# Patient Record
Sex: Male | Born: 1995 | Race: Black or African American | Hispanic: No | Marital: Single | Smoking: Current every day smoker
Health system: Southern US, Community
[De-identification: ages and names within clinical notes are randomized; demographics above are authoritative.]

## PROBLEM LIST (undated history)

## (undated) DIAGNOSIS — W3400XA Accidental discharge from unspecified firearms or gun, initial encounter: Secondary | ICD-10-CM

## (undated) DIAGNOSIS — Y249XXA Unspecified firearm discharge, undetermined intent, initial encounter: Secondary | ICD-10-CM

## (undated) HISTORY — PX: FEMUR FRACTURE SURGERY: SHX633

---
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
## 2015-01-20 ENCOUNTER — Emergency Department: Payer: Self-pay | Admitting: Emergency Medicine

## 2015-10-31 ENCOUNTER — Emergency Department
Admission: EM | Admit: 2015-10-31 | Discharge: 2015-10-31 | Disposition: A | Discharge status: Home / Self Care | Payer: Self-pay | Attending: Emergency Medicine | Admitting: Emergency Medicine

## 2015-10-31 ENCOUNTER — Emergency Department: Payer: Self-pay

## 2015-10-31 DIAGNOSIS — R600 Localized edema: Secondary | ICD-10-CM | POA: Insufficient documentation

## 2015-10-31 DIAGNOSIS — M25562 Pain in left knee: Secondary | ICD-10-CM

## 2015-10-31 HISTORY — DX: Accidental discharge from unspecified firearms or gun, initial encounter: W34.00XA

## 2015-10-31 HISTORY — DX: Unspecified firearm discharge, undetermined intent, initial encounter: Y24.9XXA

## 2015-10-31 MED ORDER — PREDNISONE 10 MG PO TABS: ORAL_TABLET | ORAL | Status: DC
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
## 2016-02-15 LAB — HM HIV SCREENING LAB: HM HIV Screening: NEGATIVE

## 2016-02-25 DIAGNOSIS — L309 Dermatitis, unspecified: Secondary | ICD-10-CM | POA: Insufficient documentation

## 2016-03-24 ENCOUNTER — Emergency Department: Payer: Self-pay

## 2016-03-24 ENCOUNTER — Encounter: Payer: Self-pay | Admitting: Emergency Medicine

## 2016-03-24 ENCOUNTER — Emergency Department
Admission: EM | Admit: 2016-03-24 | Discharge: 2016-03-24 | Disposition: A | Discharge status: Home / Self Care | Payer: Self-pay | Attending: Emergency Medicine | Admitting: Emergency Medicine

## 2016-03-24 DIAGNOSIS — M25562 Pain in left knee: Secondary | ICD-10-CM
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
## 2016-03-24 NOTE — Discharge Instructions (Signed)
Follow-up with Dr. Martha ClanKrasinski if any continued problems with your knee. Ice to to your knee after any physical activity such as sports. Also take anti-inflammatories over-the-counter after any sports activity. At this time he may take either ibuprofen 3 tablets with food 3 times a day or take Aleve 2 tablets twice a day with food.
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
## 2016-04-30 ENCOUNTER — Encounter: Payer: Self-pay | Admitting: *Deleted

## 2016-04-30 ENCOUNTER — Emergency Department
Admission: EM | Admit: 2016-04-30 | Discharge: 2016-04-30 | Disposition: A | Discharge status: Home / Self Care | Payer: Self-pay | Attending: Emergency Medicine | Admitting: Emergency Medicine

## 2016-04-30 DIAGNOSIS — M795 Residual foreign body in soft tissue: Secondary | ICD-10-CM

## 2016-04-30 DIAGNOSIS — Z1889 Other specified retained foreign body fragments: Secondary | ICD-10-CM | POA: Insufficient documentation

## 2016-04-30 DIAGNOSIS — L02211 Cutaneous abscess of abdominal wall: Secondary | ICD-10-CM | POA: Insufficient documentation

## 2016-04-30 DIAGNOSIS — F17200 Nicotine dependence, unspecified, uncomplicated: Secondary | ICD-10-CM | POA: Insufficient documentation

## 2016-04-30 MED ORDER — HYDROCODONE-ACETAMINOPHEN 5-325 MG PO TABS: 1.0000 | ORAL_TABLET | ORAL | Status: DC | PRN

## 2016-04-30 MED ORDER — CLINDAMYCIN HCL 300 MG PO CAPS: 300.0000 mg | ORAL_CAPSULE | Freq: Four times a day (QID) | ORAL | Status: DC
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
## 2016-04-30 NOTE — Discharge Instructions (Signed)
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
## 2016-05-01 ENCOUNTER — Emergency Department
Admission: EM | Admit: 2016-05-01 | Discharge: 2016-05-01 | Disposition: A | Discharge status: Home / Self Care | Payer: Self-pay | Attending: Emergency Medicine | Admitting: Emergency Medicine

## 2016-05-01 ENCOUNTER — Encounter: Payer: Self-pay | Admitting: *Deleted

## 2016-05-01 ENCOUNTER — Telehealth: Payer: Self-pay

## 2016-05-01 ENCOUNTER — Emergency Department: Payer: Self-pay

## 2016-05-01 DIAGNOSIS — F17200 Nicotine dependence, unspecified, uncomplicated: Secondary | ICD-10-CM | POA: Insufficient documentation

## 2016-05-01 DIAGNOSIS — Z79899 Other long term (current) drug therapy: Secondary | ICD-10-CM | POA: Insufficient documentation

## 2016-05-01 DIAGNOSIS — L02211 Cutaneous abscess of abdominal wall: Secondary | ICD-10-CM | POA: Insufficient documentation

## 2016-05-01 DIAGNOSIS — Z792 Long term (current) use of antibiotics: Secondary | ICD-10-CM | POA: Insufficient documentation

## 2016-05-01 DIAGNOSIS — L0291 Cutaneous abscess, unspecified: Secondary | ICD-10-CM

## 2016-05-01 DIAGNOSIS — M795 Residual foreign body in soft tissue: Secondary | ICD-10-CM

## 2016-05-01 DIAGNOSIS — W3400XA Accidental discharge from unspecified firearms or gun, initial encounter: Secondary | ICD-10-CM

## 2016-05-01 MED ORDER — LIDOCAINE-EPINEPHRINE (PF) 1 %-1:200000 IJ SOLN
10.0000 mL | Freq: Once | INTRAMUSCULAR | Status: DC
Start: 2016-05-01 — End: 2016-05-01
  Filled 2016-05-01: qty 30
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
## 2016-05-01 NOTE — Discharge Instructions (Signed)
Incision and Drainage Incision and drainage is a procedure in which a sac-like structure (cystic structure) is opened and drained. The area to be drained usually contains material such as pus, fluid, or blood.  LET YOUR CAREGIVER KNOW ABOUT:   Allergies to medicine.  Medicines taken, including vitamins, herbs, eyedrops, over-the-counter medicines, and creams.  Use of steroids (by mouth or creams).  Previous problems with anesthetics or numbing medicines.  History of bleeding problems or blood clots.  Previous surgery.  Other health problems, including diabetes and kidney problems.  Possibility of pregnancy, if this applies. RISKS AND COMPLICATIONS  Pain.  Bleeding.  Scarring.  Infection. BEFORE THE PROCEDURE  You may need to have an ultrasound or other imaging tests to see how large or deep your cystic structure is. Blood tests may also be used to determine if you have an infection or how severe the infection is. You may need to have a tetanus shot. PROCEDURE  The affected area is cleaned with a cleaning fluid. The cyst area will then be numbed with a medicine (local anesthetic). A small incision will be made in the cystic structure. A syringe or catheter may be used to drain the contents of the cystic structure, or the contents may be squeezed out. The area will then be flushed with a cleansing solution. After cleansing the area, it is often gently packed with a gauze or another wound dressing. Once it is packed, it will be covered with gauze and tape or some other type of wound dressing. AFTER THE PROCEDURE   Often, you will be allowed to go home right after the procedure.  You may be given antibiotic medicine to prevent or heal an infection.  If the area was packed with gauze or some other wound dressing, you will likely need to come back in 1 to 2 days to get it removed.  The area should heal in about 14 days.   This information is not intended to replace advice given  to you by your health care provider. Make sure you discuss any questions you have with your health care provider.   Document Released: 04/29/2001 Document Revised: 05/04/2012 Document Reviewed: 12/29/2011 Elsevier Interactive Patient Education 2016 [employer]  
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
## 2016-05-07 ENCOUNTER — Ambulatory Visit: Payer: Self-pay | Admitting: Surgery

## 2016-09-20 ENCOUNTER — Encounter: Payer: Self-pay | Admitting: Emergency Medicine

## 2016-09-20 ENCOUNTER — Emergency Department
Admission: EM | Admit: 2016-09-20 | Discharge: 2016-09-20 | Disposition: A | Discharge status: Home / Self Care | Payer: Self-pay | Attending: Emergency Medicine | Admitting: Emergency Medicine

## 2016-09-20 ENCOUNTER — Emergency Department
Admission: EM | Admit: 2016-09-20 | Discharge: 2016-09-20 | Disposition: A | Discharge status: Home / Self Care | Payer: Medicaid Other | Attending: Emergency Medicine | Admitting: Emergency Medicine

## 2016-09-20 DIAGNOSIS — R51 Headache: Secondary | ICD-10-CM | POA: Insufficient documentation

## 2016-09-20 DIAGNOSIS — F17200 Nicotine dependence, unspecified, uncomplicated: Secondary | ICD-10-CM | POA: Insufficient documentation

## 2016-09-20 DIAGNOSIS — F1290 Cannabis use, unspecified, uncomplicated: Secondary | ICD-10-CM | POA: Insufficient documentation

## 2016-09-20 DIAGNOSIS — R519 Headache, unspecified: Secondary | ICD-10-CM

## 2016-09-20 DIAGNOSIS — T407X5A Adverse effect of cannabis (derivatives), initial encounter: Secondary | ICD-10-CM | POA: Insufficient documentation

## 2016-09-20 DIAGNOSIS — G2402 Drug induced acute dystonia: Secondary | ICD-10-CM | POA: Insufficient documentation

## 2016-09-20 DIAGNOSIS — Z79899 Other long term (current) drug therapy: Secondary | ICD-10-CM | POA: Insufficient documentation

## 2016-09-20 LAB — COMPREHENSIVE METABOLIC PANEL
ALBUMIN: 4 g/dL (ref 3.5–5.0)
ALT: 20 U/L (ref 17–63)
AST: 39 U/L (ref 15–41)
Alkaline Phosphatase: 91 U/L (ref 38–126)
Anion gap: 9 (ref 5–15)
BUN: 10 mg/dL (ref 6–20)
CHLORIDE: 106 mmol/L (ref 101–111)
CO2: 22 mmol/L (ref 22–32)
Calcium: 8.7 mg/dL — ABNORMAL LOW (ref 8.9–10.3)
Creatinine, Ser: 0.96 mg/dL (ref 0.61–1.24)
GFR calc Af Amer: >60 mL/min (ref >60)
GFR calc non Af Amer: >60 mL/min (ref >60)
GLUCOSE: 117 mg/dL — AB (ref 65–99)
POTASSIUM: 3.5 mmol/L (ref 3.5–5.1)
Sodium: 137 mmol/L (ref 135–145)
Total Bilirubin: 1.4 mg/dL — ABNORMAL HIGH (ref 0.3–1.2)
Total Protein: 7.7 g/dL (ref 6.5–8.1)

## 2016-09-20 LAB — CBC WITH DIFFERENTIAL/PLATELET
BASOS ABS: 0.1 10*3/uL (ref 0–0.1)
BASOS PCT: 1 %
EOS PCT: 2 %
Eosinophils Absolute: 0.2 10*3/uL (ref 0–0.7)
HEMATOCRIT: 38.8 % — AB (ref 40.0–52.0)
Hemoglobin: 13.8 g/dL (ref 13.0–18.0)
Lymphocytes Relative: 44 %
Lymphs Abs: 4.6 10*3/uL — ABNORMAL HIGH (ref 1.0–3.6)
MCH: 33.8 pg (ref 26.0–34.0)
MCHC: 35.6 g/dL (ref 32.0–36.0)
MCV: 94.9 fL (ref 80.0–100.0)
MONO ABS: 1.3 10*3/uL — AB (ref 0.2–1.0)
Monocytes Relative: 12 %
NEUTROS ABS: 4.4 10*3/uL (ref 1.4–6.5)
Neutrophils Relative %: 41 %
PLATELETS: 249 10*3/uL (ref 150–440)
RBC: 4.09 MIL/uL — AB (ref 4.40–5.90)
RDW: 11.8 % (ref 11.5–14.5)
WBC: 10.6 10*3/uL (ref 3.8–10.6)

## 2016-09-20 MED ORDER — SODIUM CHLORIDE 0.9 % IV BOLUS (SEPSIS)
500.0000 mL | Freq: Once | INTRAVENOUS | Status: AC
  Administered 2016-09-20: 500 mL via INTRAVENOUS

## 2016-09-20 MED ORDER — PROCHLORPERAZINE MALEATE 10 MG PO TABS
10.0000 mg | ORAL_TABLET | Freq: Three times a day (TID) | ORAL | 0 refills | Status: DC | PRN
Start: 2016-09-20 — End: 2017-05-28

## 2016-09-20 MED ORDER — DIPHENHYDRAMINE HCL 25 MG PO CAPS
25.0000 mg | ORAL_CAPSULE | Freq: Four times a day (QID) | ORAL | 0 refills | Status: DC
Start: 2016-09-20 — End: 2019-03-18

## 2016-09-20 MED ORDER — DIPHENHYDRAMINE HCL 50 MG/ML IJ SOLN
25.0000 mg | Freq: Once | INTRAMUSCULAR | Status: AC
  Administered 2016-09-20: 25 mg via INTRAVENOUS
  Filled 2016-09-20: qty 1

## 2016-09-20 MED ORDER — PROCHLORPERAZINE EDISYLATE 5 MG/ML IJ SOLN
10.0000 mg | Freq: Once | INTRAMUSCULAR | Status: AC
  Administered 2016-09-20: 10 mg via INTRAVENOUS
  Filled 2016-09-20: qty 2
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
## 2016-09-20 NOTE — ED Notes (Signed)
Resting quietly at this time.

## 2016-09-20 NOTE — ED Triage Notes (Addendum)
Patient states that he took was smoking a black and mild and started feeling like his tongue was swelling. Patient states that he feels like the swelling has gone down at this time.

## 2016-09-20 NOTE — ED Notes (Signed)
Patient's friend reports he was seen earlier in the day and treated for a nose bleed.  After discharge he went home.  Upon awaking patient had jerking motions and stated it felt like his tongue was numb and swelling.  Patient with patent airway, PA to bedside.

## 2016-09-20 NOTE — ED Triage Notes (Signed)
Reports headache and nosebleed off and on since yesterday.  PERRL, ambulates well.
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
## 2016-09-20 NOTE — Discharge Instructions (Signed)
Please seek medical attention for any high fevers, chest pain, shortness of breath, change in behavior, persistent vomiting, bloody stool or any other new or concerning symptoms.  

## 2016-10-30 ENCOUNTER — Encounter: Payer: Self-pay | Admitting: Urgent Care

## 2016-10-30 DIAGNOSIS — F17200 Nicotine dependence, unspecified, uncomplicated: Secondary | ICD-10-CM | POA: Insufficient documentation

## 2016-10-30 DIAGNOSIS — R197 Diarrhea, unspecified: Secondary | ICD-10-CM | POA: Insufficient documentation

## 2016-10-30 NOTE — ED Triage Notes (Signed)
Patient presents with c/o generalized abdominal pain with (+) diarrhea x 3 days. Patient unable to report the number of diarrhea episodes. States, "everything that I eat just runs through me". Patient has also had a headache today; reports that it is intermittent in nature; resolved at this time.

## 2016-10-31 ENCOUNTER — Emergency Department
Admission: EM | Admit: 2016-10-31 | Discharge: 2016-10-31 | Disposition: A | Discharge status: Home / Self Care | Payer: Self-pay | Attending: Student in an Organized Health Care Education/Training Program | Admitting: Student in an Organized Health Care Education/Training Program

## 2016-10-31 DIAGNOSIS — R197 Diarrhea, unspecified: Secondary | ICD-10-CM

## 2016-10-31 MED ORDER — DICYCLOMINE HCL 10 MG PO CAPS
10.0000 mg | ORAL_CAPSULE | Freq: Once | ORAL | Status: AC
  Administered 2016-10-31: 10 mg via ORAL
  Filled 2016-10-31: qty 1

## 2016-10-31 MED ORDER — DICYCLOMINE HCL 10 MG PO CAPS: 10.0000 mg | ORAL_CAPSULE | Freq: Three times a day (TID) | ORAL | 0 refills | Status: DC | PRN

## 2016-10-31 MED ORDER — LOPERAMIDE HCL 2 MG PO CAPS
2.0000 mg | ORAL_CAPSULE | Freq: Once | ORAL | Status: AC
  Administered 2016-10-31: 2 mg via ORAL
  Filled 2016-10-31: qty 1

## 2016-10-31 NOTE — ED Notes (Signed)
Pt presents to ED 18 c/o of "stomach bothering me"; pt c/o of diarrhea of unspecified times; pt states this started 3 days ago; pt denies any blood in stool; pt denies nausea and vomiting; pt denies shortness of breath, dizziness, blurry vision; pt is alert and oriented x4

## 2016-10-31 NOTE — ED Notes (Signed)
Pt is in good condition; discharge instructions reviewed, follow up care and home care reviewed; pt verbalized understanding; pt is ambulatory and went home with girlfriend/spouse.

## 2016-10-31 NOTE — ED Notes (Signed)
Pt reports no nausea, vomiting, or diarrhea since ingesting saltines and ginger ale.

## 2016-10-31 NOTE — Discharge Instructions (Signed)
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
## 2016-10-31 NOTE — ED Notes (Signed)
Pt given one packet of saltine crackers and half can of ginger ale over ice to eat and drink per MD order as PO challenge. Will monitor for tolerance.

## 2016-12-24 ENCOUNTER — Encounter: Payer: Self-pay | Admitting: Emergency Medicine

## 2016-12-24 DIAGNOSIS — Z5321 Procedure and treatment not carried out due to patient leaving prior to being seen by health care provider: Secondary | ICD-10-CM | POA: Insufficient documentation

## 2016-12-24 DIAGNOSIS — F17200 Nicotine dependence, unspecified, uncomplicated: Secondary | ICD-10-CM | POA: Insufficient documentation

## 2016-12-24 DIAGNOSIS — R109 Unspecified abdominal pain: Secondary | ICD-10-CM | POA: Insufficient documentation

## 2016-12-24 LAB — CBC
HEMATOCRIT: 38.9 % — AB (ref 40.0–52.0)
Hemoglobin: 13.5 g/dL (ref 13.0–18.0)
MCH: 33 pg (ref 26.0–34.0)
MCHC: 34.7 g/dL (ref 32.0–36.0)
MCV: 95.2 fL (ref 80.0–100.0)
Platelets: 209 10*3/uL (ref 150–440)
RBC: 4.09 MIL/uL — AB (ref 4.40–5.90)
RDW: 12.2 % (ref 11.5–14.5)
WBC: 7.1 10*3/uL (ref 3.8–10.6)

## 2016-12-24 LAB — COMPREHENSIVE METABOLIC PANEL
ALT: 115 U/L — ABNORMAL HIGH (ref 17–63)
AST: 197 U/L — AB (ref 15–41)
Albumin: 4.2 g/dL (ref 3.5–5.0)
Alkaline Phosphatase: 74 U/L (ref 38–126)
Anion gap: 7 (ref 5–15)
BUN: 13 mg/dL (ref 6–20)
CHLORIDE: 108 mmol/L (ref 101–111)
CO2: 24 mmol/L (ref 22–32)
Calcium: 9.2 mg/dL (ref 8.9–10.3)
Creatinine, Ser: 0.96 mg/dL (ref 0.61–1.24)
GFR calc non Af Amer: >60 mL/min (ref >60)
Glucose, Bld: 109 mg/dL — ABNORMAL HIGH (ref 65–99)
POTASSIUM: 3.5 mmol/L (ref 3.5–5.1)
Sodium: 139 mmol/L (ref 135–145)
Total Bilirubin: 2.1 mg/dL — ABNORMAL HIGH (ref 0.3–1.2)
Total Protein: 7.5 g/dL (ref 6.5–8.1)

## 2016-12-24 LAB — LIPASE, BLOOD: LIPASE: 20 U/L (ref 11–51)

## 2016-12-24 NOTE — ED Triage Notes (Signed)
Patient ambulatory to triage with steady gait, without difficulty or distress noted; pt reports mid abd pain with no accomp symptoms x 2 days

## 2016-12-25 ENCOUNTER — Emergency Department
Admission: EM | Admit: 2016-12-25 | Discharge: 2016-12-25 | Disposition: A | Discharge status: Home / Self Care | Payer: Self-pay | Attending: Emergency Medicine | Admitting: Emergency Medicine

## 2016-12-25 ENCOUNTER — Telehealth: Payer: Self-pay | Admitting: Emergency Medicine

## 2016-12-25 NOTE — Telephone Encounter (Signed)
Called patient due to lwot to inquire about condition and follow up plans. He said the wait was too long. I told him we had done the lab tests and that there were some that were abnormal that can be related tohis liver.  I told him he could return or go to an urgent care (he does not have a pcp.)

## 2017-01-14 ENCOUNTER — Emergency Department
Admission: EM | Admit: 2017-01-14 | Discharge: 2017-01-14 | Disposition: A | Discharge status: Home / Self Care | Payer: Self-pay | Attending: Emergency Medicine | Admitting: Emergency Medicine

## 2017-01-14 ENCOUNTER — Emergency Department: Payer: Self-pay

## 2017-01-14 ENCOUNTER — Encounter: Payer: Self-pay | Admitting: *Deleted

## 2017-01-14 DIAGNOSIS — S62339A Displaced fracture of neck of unspecified metacarpal bone, initial encounter for closed fracture: Secondary | ICD-10-CM

## 2017-01-14 DIAGNOSIS — Y999 Unspecified external cause status: Secondary | ICD-10-CM | POA: Insufficient documentation

## 2017-01-14 DIAGNOSIS — W2201XA Walked into wall, initial encounter: Secondary | ICD-10-CM | POA: Insufficient documentation

## 2017-01-14 DIAGNOSIS — F17200 Nicotine dependence, unspecified, uncomplicated: Secondary | ICD-10-CM | POA: Insufficient documentation

## 2017-01-14 DIAGNOSIS — Y939 Activity, unspecified: Secondary | ICD-10-CM | POA: Insufficient documentation

## 2017-01-14 DIAGNOSIS — Y929 Unspecified place or not applicable: Secondary | ICD-10-CM | POA: Insufficient documentation

## 2017-01-14 DIAGNOSIS — S62367A Nondisplaced fracture of neck of fifth metacarpal bone, left hand, initial encounter for closed fracture: Secondary | ICD-10-CM | POA: Insufficient documentation

## 2017-01-14 MED ORDER — NAPROXEN 500 MG PO TABS: 500.0000 mg | ORAL_TABLET | Freq: Two times a day (BID) | ORAL | 0 refills | Status: DC
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
## 2017-01-14 NOTE — Discharge Instructions (Signed)
Please wear splint until he follow-up with orthopedics. Keep splint clean and dry. Take naproxen as needed for pain.

## 2017-02-05 ENCOUNTER — Emergency Department
Admission: EM | Admit: 2017-02-05 | Discharge: 2017-02-05 | Disposition: A | Discharge status: Home / Self Care | Payer: No Typology Code available for payment source | Attending: Emergency Medicine | Admitting: Emergency Medicine

## 2017-02-05 ENCOUNTER — Encounter: Payer: Self-pay | Admitting: Emergency Medicine

## 2017-02-05 DIAGNOSIS — S161XXA Strain of muscle, fascia and tendon at neck level, initial encounter: Secondary | ICD-10-CM | POA: Insufficient documentation

## 2017-02-05 DIAGNOSIS — F17200 Nicotine dependence, unspecified, uncomplicated: Secondary | ICD-10-CM | POA: Diagnosis not present

## 2017-02-05 DIAGNOSIS — Y939 Activity, unspecified: Secondary | ICD-10-CM | POA: Diagnosis not present

## 2017-02-05 DIAGNOSIS — S199XXA Unspecified injury of neck, initial encounter: Secondary | ICD-10-CM | POA: Diagnosis present

## 2017-02-05 DIAGNOSIS — Y999 Unspecified external cause status: Secondary | ICD-10-CM | POA: Diagnosis not present

## 2017-02-05 DIAGNOSIS — Y92410 Unspecified street and highway as the place of occurrence of the external cause: Secondary | ICD-10-CM | POA: Diagnosis not present

## 2017-02-05 MED ORDER — METHOCARBAMOL 500 MG PO TABS: 500.0000 mg | ORAL_TABLET | Freq: Four times a day (QID) | ORAL | 0 refills | Status: DC

## 2017-02-05 MED ORDER — MELOXICAM 15 MG PO TABS: 15.0000 mg | ORAL_TABLET | Freq: Every day | ORAL | 0 refills | Status: DC
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
## 2017-02-05 NOTE — ED Notes (Signed)
NAD noted at time of D/C. Pt denies questions or concerns. Pt ambulatory to the lobby at this time.  

## 2017-03-23 ENCOUNTER — Encounter: Payer: Self-pay | Admitting: Emergency Medicine

## 2017-03-23 ENCOUNTER — Emergency Department
Admission: EM | Admit: 2017-03-23 | Discharge: 2017-03-23 | Disposition: A | Discharge status: Home / Self Care | Payer: Self-pay | Attending: Emergency Medicine | Admitting: Emergency Medicine

## 2017-03-23 DIAGNOSIS — E860 Dehydration: Secondary | ICD-10-CM | POA: Insufficient documentation

## 2017-03-23 DIAGNOSIS — R42 Dizziness and giddiness: Secondary | ICD-10-CM

## 2017-03-23 DIAGNOSIS — Z79899 Other long term (current) drug therapy: Secondary | ICD-10-CM | POA: Insufficient documentation

## 2017-03-23 DIAGNOSIS — F17200 Nicotine dependence, unspecified, uncomplicated: Secondary | ICD-10-CM | POA: Insufficient documentation

## 2017-03-23 LAB — URINALYSIS, COMPLETE (UACMP) WITH MICROSCOPIC
Bacteria, UA: NONE SEEN
Bilirubin Urine: NEGATIVE
GLUCOSE, UA: NEGATIVE mg/dL
HGB URINE DIPSTICK: NEGATIVE
KETONES UR: NEGATIVE mg/dL
LEUKOCYTES UA: NEGATIVE
Nitrite: NEGATIVE
PH: 6 (ref 5.0–8.0)
Protein, ur: NEGATIVE mg/dL
RBC / HPF: NONE SEEN RBC/hpf (ref 0–5)
Specific Gravity, Urine: 1.011 (ref 1.005–1.030)

## 2017-03-23 LAB — BASIC METABOLIC PANEL
Anion gap: 9 (ref 5–15)
BUN: 11 mg/dL (ref 6–20)
CHLORIDE: 106 mmol/L (ref 101–111)
CO2: 24 mmol/L (ref 22–32)
CREATININE: 0.98 mg/dL (ref 0.61–1.24)
Calcium: 9 mg/dL (ref 8.9–10.3)
GFR calc non Af Amer: >60 mL/min (ref >60)
Glucose, Bld: 84 mg/dL (ref 65–99)
Potassium: 3.3 mmol/L — ABNORMAL LOW (ref 3.5–5.1)
Sodium: 139 mmol/L (ref 135–145)

## 2017-03-23 LAB — CBC
HCT: 40 % (ref 40.0–52.0)
Hemoglobin: 14 g/dL (ref 13.0–18.0)
MCH: 33.2 pg (ref 26.0–34.0)
MCHC: 35 g/dL (ref 32.0–36.0)
MCV: 94.8 fL (ref 80.0–100.0)
PLATELETS: 225 10*3/uL (ref 150–440)
RBC: 4.22 MIL/uL — ABNORMAL LOW (ref 4.40–5.90)
RDW: 12.1 % (ref 11.5–14.5)
WBC: 6.3 10*3/uL (ref 3.8–10.6)
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
## 2017-03-23 NOTE — ED Triage Notes (Signed)
Pt reports increased dizziness and weakness x2 days. 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 Pt denies any blood borne exposure. Pt ambulatory to triage room with no difficulty or distress noted.

## 2017-03-23 NOTE — ED Notes (Signed)
Cup of water given.

## 2017-03-29 ENCOUNTER — Emergency Department
Admission: EM | Admit: 2017-03-29 | Discharge: 2017-03-29 | Disposition: A | Discharge status: Home / Self Care | Payer: Self-pay | Attending: Emergency Medicine | Admitting: Emergency Medicine

## 2017-03-29 ENCOUNTER — Encounter: Payer: Self-pay | Admitting: Emergency Medicine

## 2017-03-29 ENCOUNTER — Emergency Department: Payer: Self-pay

## 2017-03-29 DIAGNOSIS — Z23 Encounter for immunization: Secondary | ICD-10-CM | POA: Insufficient documentation

## 2017-03-29 DIAGNOSIS — Y999 Unspecified external cause status: Secondary | ICD-10-CM | POA: Insufficient documentation

## 2017-03-29 DIAGNOSIS — S41112A Laceration without foreign body of left upper arm, initial encounter: Secondary | ICD-10-CM | POA: Insufficient documentation

## 2017-03-29 DIAGNOSIS — T148XXA Other injury of unspecified body region, initial encounter: Secondary | ICD-10-CM

## 2017-03-29 DIAGNOSIS — W260XXA Contact with knife, initial encounter: Secondary | ICD-10-CM | POA: Insufficient documentation

## 2017-03-29 DIAGNOSIS — Z79899 Other long term (current) drug therapy: Secondary | ICD-10-CM | POA: Insufficient documentation

## 2017-03-29 DIAGNOSIS — Y939 Activity, unspecified: Secondary | ICD-10-CM | POA: Insufficient documentation

## 2017-03-29 DIAGNOSIS — Y92019 Unspecified place in single-family (private) house as the place of occurrence of the external cause: Secondary | ICD-10-CM | POA: Insufficient documentation

## 2017-03-29 DIAGNOSIS — F17210 Nicotine dependence, cigarettes, uncomplicated: Secondary | ICD-10-CM | POA: Insufficient documentation

## 2017-03-29 MED ORDER — AMOXICILLIN-POT CLAVULANATE 875-125 MG PO TABS: 1.0000 | ORAL_TABLET | Freq: Two times a day (BID) | ORAL | 0 refills | Status: AC

## 2017-03-29 MED ORDER — LIDOCAINE HCL (PF) 1 % IJ SOLN
INTRAMUSCULAR | Status: AC
  Administered 2017-03-29: 5 mL via INTRADERMAL
  Filled 2017-03-29: qty 5

## 2017-03-29 MED ORDER — LIDOCAINE HCL (PF) 1 % IJ SOLN
5.0000 mL | Freq: Once | INTRAMUSCULAR | Status: AC
  Administered 2017-03-29: 5 mL via INTRADERMAL

## 2017-03-29 MED ORDER — OXYCODONE-ACETAMINOPHEN 5-325 MG PO TABS
1.0000 | ORAL_TABLET | Freq: Once | ORAL | Status: AC
  Administered 2017-03-29: 1 via ORAL
  Filled 2017-03-29: qty 1

## 2017-03-29 MED ORDER — TETANUS-DIPHTH-ACELL PERTUSSIS 5-2.5-18.5 LF-MCG/0.5 IM SUSP
0.5000 mL | Freq: Once | INTRAMUSCULAR | Status: AC
  Administered 2017-03-29: 0.5 mL via INTRAMUSCULAR
  Filled 2017-03-29: qty 0.5
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
## 2017-03-29 NOTE — ED Notes (Signed)
Pts elbow wrapped with coban, ice applied to area.
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
## 2017-03-29 NOTE — Discharge Instructions (Signed)
Please follow up

## 2017-05-28 ENCOUNTER — Emergency Department
Admission: EM | Admit: 2017-05-28 | Discharge: 2017-05-28 | Disposition: A | Discharge status: Home / Self Care | Payer: Medicaid Other | Attending: Emergency Medicine | Admitting: Emergency Medicine

## 2017-05-28 DIAGNOSIS — L989 Disorder of the skin and subcutaneous tissue, unspecified: Secondary | ICD-10-CM | POA: Insufficient documentation

## 2017-05-28 DIAGNOSIS — L089 Local infection of the skin and subcutaneous tissue, unspecified: Secondary | ICD-10-CM

## 2017-05-28 DIAGNOSIS — Z79899 Other long term (current) drug therapy: Secondary | ICD-10-CM | POA: Insufficient documentation

## 2017-05-28 DIAGNOSIS — T148XXA Other injury of unspecified body region, initial encounter: Secondary | ICD-10-CM

## 2017-05-28 MED ORDER — TRIAMCINOLONE ACETONIDE 0.1 % EX CREA: 1.0000 "application " | TOPICAL_CREAM | Freq: Two times a day (BID) | CUTANEOUS | 0 refills | Status: DC

## 2017-05-28 MED ORDER — SULFAMETHOXAZOLE-TRIMETHOPRIM 800-160 MG PO TABS: 1.0000 | ORAL_TABLET | Freq: Two times a day (BID) | ORAL | 0 refills | Status: DC

## 2017-05-28 NOTE — ED Triage Notes (Signed)
Right upper arm abrasion and wound, circular. Pt denies injury, states that he has been scratching at it. Pt alert and oriented X4, active, cooperative, pt in NAD. RR even and unlabored, color WNL.

## 2017-05-28 NOTE — Discharge Instructions (Signed)
Follow-up with primary care doctor or the clinic if any continued problems. Begin using Bactrim DS twice a day for 10 days. Use Kenalog cream as needed for itching. Cut Fingernails back to where you cannot scratch these areas.
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
## 2017-12-30 ENCOUNTER — Emergency Department
Admission: EM | Admit: 2017-12-30 | Discharge: 2017-12-30 | Discharge status: Against Medical Advice | Payer: Self-pay | Attending: Emergency Medicine | Admitting: Emergency Medicine

## 2017-12-30 ENCOUNTER — Encounter: Payer: Self-pay | Admitting: Emergency Medicine

## 2017-12-30 DIAGNOSIS — R109 Unspecified abdominal pain: Secondary | ICD-10-CM | POA: Insufficient documentation

## 2017-12-30 DIAGNOSIS — Z5321 Procedure and treatment not carried out due to patient leaving prior to being seen by health care provider: Secondary | ICD-10-CM | POA: Insufficient documentation

## 2017-12-30 DIAGNOSIS — R110 Nausea: Secondary | ICD-10-CM | POA: Insufficient documentation

## 2017-12-30 LAB — CBC
HCT: 43 % (ref 40.0–52.0)
HEMOGLOBIN: 14.6 g/dL (ref 13.0–18.0)
MCH: 33.2 pg (ref 26.0–34.0)
MCHC: 34 g/dL (ref 32.0–36.0)
MCV: 97.5 fL (ref 80.0–100.0)
Platelets: 231 10*3/uL (ref 150–440)
RBC: 4.41 MIL/uL (ref 4.40–5.90)
RDW: 11.8 % (ref 11.5–14.5)
WBC: 11.9 10*3/uL — ABNORMAL HIGH (ref 3.8–10.6)

## 2017-12-30 LAB — COMPREHENSIVE METABOLIC PANEL
ALBUMIN: 4.1 g/dL (ref 3.5–5.0)
ALK PHOS: 91 U/L (ref 38–126)
ALT: 22 U/L (ref 17–63)
ANION GAP: 7 (ref 5–15)
AST: 27 U/L (ref 15–41)
BILIRUBIN TOTAL: 2.1 mg/dL — AB (ref 0.3–1.2)
BUN: 12 mg/dL (ref 6–20)
CALCIUM: 9.4 mg/dL (ref 8.9–10.3)
CO2: 24 mmol/L (ref 22–32)
Chloride: 107 mmol/L (ref 101–111)
Creatinine, Ser: 1.27 mg/dL — ABNORMAL HIGH (ref 0.61–1.24)
GFR calc Af Amer: >60 mL/min (ref >60)
GFR calc non Af Amer: >60 mL/min (ref >60)
GLUCOSE: 97 mg/dL (ref 65–99)
Potassium: 3.5 mmol/L (ref 3.5–5.1)
SODIUM: 138 mmol/L (ref 135–145)
TOTAL PROTEIN: 7.4 g/dL (ref 6.5–8.1)

## 2017-12-30 LAB — LIPASE, BLOOD: Lipase: 23 U/L (ref 11–51)

## 2017-12-30 NOTE — ED Notes (Signed)
No answer when patient called

## 2017-12-30 NOTE — ED Triage Notes (Signed)
Pt reports nausea and abdominal pain that started last night. Pt reports pain as a cramping sensation and states that the pain and nausea are intermittent. Pt denies any episodes of emesis or diarrhea. Pt tried taking antinausea medication which he states "worked for a little while."

## 2017-12-31 ENCOUNTER — Telehealth: Payer: Self-pay | Admitting: Emergency Medicine

## 2017-12-31 NOTE — Telephone Encounter (Signed)
Called patient due to lwot to inquire about condition and follow up plans. Person who answered hung up.

## 2019-03-18 ENCOUNTER — Encounter: Payer: Self-pay | Admitting: Emergency Medicine

## 2019-03-18 ENCOUNTER — Emergency Department: Payer: Self-pay

## 2019-03-18 ENCOUNTER — Other Ambulatory Visit: Payer: Self-pay

## 2019-03-18 DIAGNOSIS — S060X0A Concussion without loss of consciousness, initial encounter: Secondary | ICD-10-CM | POA: Insufficient documentation

## 2019-03-18 DIAGNOSIS — W500XXA Accidental hit or strike by another person, initial encounter: Secondary | ICD-10-CM | POA: Insufficient documentation

## 2019-03-18 DIAGNOSIS — Y939 Activity, unspecified: Secondary | ICD-10-CM | POA: Insufficient documentation

## 2019-03-18 DIAGNOSIS — S0083XA Contusion of other part of head, initial encounter: Secondary | ICD-10-CM | POA: Insufficient documentation

## 2019-03-18 DIAGNOSIS — R55 Syncope and collapse: Secondary | ICD-10-CM | POA: Insufficient documentation

## 2019-03-18 DIAGNOSIS — M542 Cervicalgia: Secondary | ICD-10-CM | POA: Insufficient documentation

## 2019-03-18 DIAGNOSIS — Y92009 Unspecified place in unspecified non-institutional (private) residence as the place of occurrence of the external cause: Secondary | ICD-10-CM | POA: Insufficient documentation

## 2019-03-18 DIAGNOSIS — Y9371 Activity, boxing: Secondary | ICD-10-CM | POA: Insufficient documentation

## 2019-03-18 DIAGNOSIS — Y999 Unspecified external cause status: Secondary | ICD-10-CM | POA: Insufficient documentation
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
## 2019-03-19 ENCOUNTER — Other Ambulatory Visit: Payer: Self-pay

## 2019-03-19 ENCOUNTER — Emergency Department: Payer: Self-pay

## 2019-03-19 ENCOUNTER — Emergency Department
Admission: EM | Admit: 2019-03-19 | Discharge: 2019-03-19 | Disposition: A | Discharge status: Home / Self Care | Payer: Self-pay | Attending: Student in an Organized Health Care Education/Training Program | Admitting: Student in an Organized Health Care Education/Training Program

## 2019-03-19 DIAGNOSIS — S060X0A Concussion without loss of consciousness, initial encounter: Secondary | ICD-10-CM

## 2019-03-19 MED ORDER — ACETAMINOPHEN 500 MG PO TABS
1000.0000 mg | ORAL_TABLET | Freq: Once | ORAL | Status: AC
  Administered 2019-03-19: 1000 mg via ORAL
  Filled 2019-03-19: qty 2

## 2019-03-19 MED ORDER — ONDANSETRON 4 MG PO TBDP
4.0000 mg | ORAL_TABLET | Freq: Once | ORAL | Status: AC
  Administered 2019-03-19: 4 mg via ORAL
  Filled 2019-03-19: qty 1

## 2019-03-19 MED ORDER — BUTALBITAL-APAP-CAFFEINE 50-325-40 MG PO TABS: 1.0000 | ORAL_TABLET | Freq: Four times a day (QID) | ORAL | 0 refills | Status: DC | PRN
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
## 2019-03-19 NOTE — ED Notes (Signed)
Patient transported to CT 

## 2019-03-19 NOTE — ED Notes (Signed)
Patient to CT with Pam.

## 2019-03-19 NOTE — Discharge Instructions (Signed)
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
## 2019-09-17 ENCOUNTER — Other Ambulatory Visit: Payer: Self-pay

## 2019-09-17 ENCOUNTER — Emergency Department
Admission: EM | Admit: 2019-09-17 | Discharge: 2019-09-17 | Disposition: A | Discharge status: Home / Self Care | Payer: Medicaid Other | Attending: Emergency Medicine | Admitting: Emergency Medicine

## 2019-09-17 ENCOUNTER — Encounter: Payer: Self-pay | Admitting: Emergency Medicine

## 2019-09-17 DIAGNOSIS — K047 Periapical abscess without sinus: Secondary | ICD-10-CM | POA: Insufficient documentation

## 2019-09-17 MED ORDER — AMOXICILLIN 500 MG PO CAPS: 500.0000 mg | ORAL_CAPSULE | Freq: Three times a day (TID) | ORAL | 0 refills | Status: DC

## 2019-09-17 MED ORDER — AMOXICILLIN 500 MG PO CAPS
500.0000 mg | ORAL_CAPSULE | Freq: Once | ORAL | Status: AC
  Administered 2019-09-17: 19:00:00 500 mg via ORAL
  Filled 2019-09-17: qty 1

## 2019-09-17 MED ORDER — LIDOCAINE VISCOUS HCL 2 % MT SOLN
15.0000 mL | Freq: Once | OROMUCOSAL | Status: AC
  Administered 2019-09-17: 15 mL via OROMUCOSAL
  Filled 2019-09-17: qty 15

## 2019-09-17 MED ORDER — LIDOCAINE VISCOUS HCL 2 % MT SOLN: 10.0000 mL | OROMUCOSAL | 0 refills | Status: DC | PRN

## 2019-09-17 MED ORDER — KETOROLAC TROMETHAMINE 30 MG/ML IJ SOLN
30.0000 mg | Freq: Once | INTRAMUSCULAR | Status: AC
  Administered 2019-09-17: 19:00:00 30 mg via INTRAMUSCULAR
  Filled 2019-09-17: qty 1
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
## 2019-09-17 NOTE — ED Triage Notes (Signed)
L lower dental pain started today.

## 2019-09-17 NOTE — Discharge Instructions (Signed)
OPTIONS FOR DENTAL FOLLOW UP CARE ° °[place] Department of Health and Human Services - Local Safety Net Dental Clinics °http://www.ncdhhs.gov/dph/oralhealth/services/safetynetclinics.htm °  °[facility] (336-562-3123) ° °[facility] [place] (919-933-9087) ° °[facility] (919-663-1744 ext [phone]) ° °[facility] ([phone]) ° °[facility] ([phone]) °This clinic caters to the indigent population and is on a lottery system. °Location: °[facility], [address], [place] °Clinic Hours: °Wednesdays from 6pm - 9pm, patients seen by a lottery system. °For dates, call or go to www.med.[facility].edu/[facility]/patients/Dental-[facility] °Services: °Cleanings, fillings and simple extractions. °Payment Options: °DENTAL WORK IS FREE OF CHARGE. Bring proof of income or support. °Best way to get seen: °Arrive at 5:15 pm - this is a lottery, NOT first come/first serve, so arriving earlier will not increase your chances of being seen. °  °  °[facility] °[phone] °Select option 1 for emergencies °  °Location: °[facility], [address], [place] °Clinic Hours: °No walk-ins accepted - call the day before to schedule an appointment. °Check in times are 9:30 am and 1:30 pm. °Services: °Simple extractions, temporary fillings, pulpectomy/pulp debridement, uncomplicated abscess drainage. °Payment Options: °PAYMENT IS DUE AT THE TIME OF SERVICE.  Fee is usually $100-200, additional surgical procedures (e.g. abscess drainage) may be extra. °Cash, checks, Visa/MasterCard accepted.  Can file Medicaid if patient is covered for dental - patient should call case worker to check. °No discount for [facility] patients. °Best way to get seen: °MUST call the day before and get onto the schedule. Can usually be seen the next 1-2 days. No walk-ins accepted. °  °  °[facility] °[phone] °   °Location: °[facility], [address], [place] °Clinic Hours: °M, W, Th, F 8am or 1:30pm, Tues 9a or 1:30 - first come/first served. °Services: °Simple extractions, temporary fillings, uncomplicated abscess drainage.  You do not need to be an [place] resident. °Payment Options: °PAYMENT IS DUE AT THE TIME OF SERVICE. °Dental insurance, otherwise sliding scale - bring proof of income or support. °Depending on income and treatment needed, cost is usually $50-200. °Best way to get seen: °Arrive early as it is first come/first served. °  °  °[facility] °[phone] °  °Location: °[address] °Clinic Hours: °Mon-Thu 8a-5p °Services: °Most basic dental services including extractions and fillings. °Payment Options: °PAYMENT IS DUE AT THE TIME OF SERVICE. °Sliding scale, up to 50% off - bring proof if income or support. °Medicaid with dental option accepted. °Best way to get seen: °Call to schedule an appointment, can usually be seen within 2 weeks OR they will try to see walk-ins - show up at 8a or 2p (you may have to wait). °  °  °[facility] °[phone] °[place] RESIDENTS ONLY °  °Location: °[facility], [address], [place], [place] [address] °Clinic Hours: By appointment only. °Monday - Thursday 8am-5pm, Friday 8am-12pm °Services: Cleanings, fillings, extractions. °Payment Options: °PAYMENT IS DUE AT THE TIME OF SERVICE. °Cash, Visa or MasterCard. Sliding scale - $30 minimum per service. °Best way to get seen: °Come in to office, complete packet and make an appointment - need proof of income °or support monies for each household member and proof of [place] residence. °Usually takes about a month to get in. °  °  °[facility] °[phone] °  °Location: °[address]   [place] °Clinic Hours: Walk-in Urgent Care Dental Services are offered Monday-Friday  mornings only. °The numbers of emergencies accepted daily is limited to the number of °providers available. °Maximum 15 - Mondays, Wednesdays & Thursdays °Maximum 10 - Tuesdays & Fridays °Services: °You do not need to be a [place] resident to be seen for a dental emergency. °Emergencies are defined as pain, swelling, abnormal bleeding, or dental trauma. Walkins will receive x-rays if needed. °NOTE: Dental cleaning is not an emergency. °Payment Options: °PAYMENT IS DUE AT THE TIME OF SERVICE. °Minimum co-pay is $40.00 for uninsured patients. °Minimum co-pay is $3.00 for Medicaid with dental coverage. °Dental Insurance is accepted and must be presented at time of visit. °Medicare does not cover dental. °Forms of payment: Cash, credit card, checks. °Best way to get seen: °If not previously registered with the clinic, walk-in dental registration begins at 7:15 am and is on a first come/first serve basis. °If previously registered with the clinic, call to make an appointment. °  °  °[facility] °[phone] °[place] RESIDENTS ONLY °  °Location: °[address], [place], [place] °Clinic Hours: °Mon-Thu 10a-2p °Services: Extractions only! °Payment Options: °FREE (donations accepted) - bring proof of income or support °Best way to get seen: °Call and schedule an appointment OR come at 8am on the 1st Monday of every month (except for holidays) when it is first come/first served. °  °  °[facility] °[phone] °  °Location: °[address], [place] °Clinic Hours: °Friday mornings °Services, Payment Options, Best way to get seen: °Call for info °

## 2019-10-13 ENCOUNTER — Other Ambulatory Visit: Payer: Self-pay

## 2019-10-13 ENCOUNTER — Emergency Department
Admission: EM | Admit: 2019-10-13 | Discharge: 2019-10-13 | Disposition: A | Discharge status: Home / Self Care | Payer: Medicaid Other | Attending: Emergency Medicine | Admitting: Emergency Medicine

## 2019-10-13 ENCOUNTER — Encounter: Payer: Self-pay | Admitting: Emergency Medicine

## 2019-10-13 DIAGNOSIS — K0889 Other specified disorders of teeth and supporting structures: Secondary | ICD-10-CM | POA: Insufficient documentation

## 2019-10-13 MED ORDER — LIDOCAINE VISCOUS HCL 2 % MT SOLN: OROMUCOSAL | 0 refills | Status: DC

## 2019-10-13 MED ORDER — CLINDAMYCIN HCL 150 MG PO CAPS: ORAL_CAPSULE | ORAL | 0 refills | Status: DC

## 2019-10-13 MED ORDER — KETOROLAC TROMETHAMINE 30 MG/ML IJ SOLN
30.0000 mg | Freq: Once | INTRAMUSCULAR | Status: AC
  Administered 2019-10-13: 12:00:00 30 mg via INTRAMUSCULAR
  Filled 2019-10-13: qty 1
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
## 2019-10-13 NOTE — ED Triage Notes (Signed)
Pt reports toothache for the past month to left side.

## 2019-10-13 NOTE — ED Notes (Signed)
Pt visualized walking out of ED at this time. Ambulatory with steady gait. UTA med response at this time. Pt visualized in NAD.

## 2019-10-13 NOTE — Discharge Instructions (Addendum)
Call make an appointment with one of the dental clinics listed on your discharge papers are the walk-in information for the [facility] clinic is listed on a separate paper.  Your tooth pain will continue off and known until this has been seen by dentist.  Begin taking antibiotics until completely finished.  You may take Tylenol or ibuprofen as needed for pain.

## 2019-12-28 ENCOUNTER — Ambulatory Visit: Payer: Self-pay | Admitting: Physician Assistant

## 2019-12-28 ENCOUNTER — Other Ambulatory Visit: Payer: Self-pay

## 2019-12-28 ENCOUNTER — Encounter: Payer: Self-pay | Admitting: Physician Assistant

## 2019-12-28 DIAGNOSIS — Z113 Encounter for screening for infections with a predominantly sexual mode of transmission: Secondary | ICD-10-CM

## 2019-12-28 DIAGNOSIS — N341 Nonspecific urethritis: Secondary | ICD-10-CM

## 2019-12-28 LAB — GRAM STAIN

## 2019-12-28 MED ORDER — AZITHROMYCIN 500 MG PO TABS
1000.0000 mg | ORAL_TABLET | Freq: Once | ORAL | Status: AC
  Administered 2019-12-28: 1000 mg via ORAL

## 2019-12-28 NOTE — Progress Notes (Signed)
Gram stain reviewed by provider; administered 1 gram Azithromycin po x 1 DOT for tx of NGU. Provider orders completed.

## 2019-12-28 NOTE — Progress Notes (Signed)
   [facility] Department STI clinic/screening visit  Subjective:  Gabriel Baker is a 24 y.o. male being seen today for an STI screening visit. The patient reports they do not have symptoms.    Patient has the following medical conditions:   Patient Active Problem List   Diagnosis Date Noted  . Eczema 02/25/2016     Chief Complaint  Patient presents with  . SEXUALLY TRANSMITTED DISEASE    STD screening including bloodwork    HPI  Patient reports that he is not having any symptoms but a partner sent him a text and told him that he needed to get checked.  Denies chronic conditions and h/o surgery.  Denies any current medicines.     See flowsheet for further details and programmatic requirements.    The following portions of the patient's history were reviewed and updated as appropriate: allergies, current medications, past medical history, past social history, past surgical history and problem list.  Objective:  There were no vitals filed for this visit.  Physical Exam Constitutional:      General: He is not in acute distress.    Appearance: Normal appearance. He is normal weight.  HENT:     Head: Normocephalic and atraumatic.     Comments: No nits, lice, or hair loss. No cervical, supraclavicular or axillary adenopathy.    Mouth/Throat:     Mouth: Mucous membranes are moist.     Pharynx: Oropharynx is clear. No oropharyngeal exudate or posterior oropharyngeal erythema.  Eyes:     Conjunctiva/sclera: Conjunctivae normal.  Pulmonary:     Effort: Pulmonary effort is normal.  Abdominal:     Palpations: There is no mass.     Tenderness: There is no abdominal tenderness. There is no guarding or rebound.  Genitourinary:    Penis: Normal.      Testes: Normal.     Comments: Pubic area without nits, lice, edema, erythema, lesions and inguinal adenopathy. Penis circumcised, without rash, lesions and discharge at meatus. Musculoskeletal:     Cervical back:  Neck supple. No tenderness.  Skin:    General: Skin is warm and dry.     Findings: No bruising, erythema, lesion or rash.  Neurological:     Mental Status: He is alert and oriented to person, place, and time.  Psychiatric:        Mood and Affect: Mood normal.        Behavior: Behavior normal.        Thought Content: Thought content normal.        Judgment: Judgment normal.       Assessment and Plan:  Gabriel Baker is a 24 y.o. male presenting to the [facility] Department for STI screening  1. Screening for STD (sexually transmitted disease) Patient into clinic without symptoms. Rec condoms with all sex. Await test results.  Counseled that RN will call if needs to RTC for treatment once results are back. - Gram stain - Gonococcus culture - HIV [place] [facility] - Syphilis Serology, [facility]  2. NGU (nongonococcal urethritis) Patient with NGU on Gram stain. Treat with Azithromycin 1g po DOT today. No sex for 7 days and until after partner completes treatment. RTC if vomits < 2 hr after taking medicine for re-treatment. - azithromycin (ZITHROMAX) tablet 1,000 mg     Return in about 3 months (around 03/26/2020) for TOC.  No future appointments.  Matt Holmes, PA

## 2020-01-01 LAB — GONOCOCCUS CULTURE

## 2020-03-21 ENCOUNTER — Emergency Department
Admission: EM | Admit: 2020-03-21 | Discharge: 2020-03-21 | Disposition: A | Discharge status: Home / Self Care | Payer: Medicaid Other | Attending: Student in an Organized Health Care Education/Training Program | Admitting: Student in an Organized Health Care Education/Training Program

## 2020-03-21 ENCOUNTER — Other Ambulatory Visit: Payer: Self-pay

## 2020-03-21 DIAGNOSIS — K0889 Other specified disorders of teeth and supporting structures: Secondary | ICD-10-CM | POA: Insufficient documentation

## 2020-03-21 DIAGNOSIS — Z79899 Other long term (current) drug therapy: Secondary | ICD-10-CM | POA: Insufficient documentation

## 2020-03-21 HISTORY — DX: Unspecified firearm discharge, undetermined intent, initial encounter: Y24.9XXA

## 2020-03-21 HISTORY — DX: Accidental discharge from unspecified firearms or gun, initial encounter: W34.00XA

## 2020-03-21 MED ORDER — LIDOCAINE VISCOUS HCL 2 % MT SOLN: 5.0000 mL | Freq: Four times a day (QID) | OROMUCOSAL | 0 refills | Status: DC | PRN

## 2020-03-21 MED ORDER — LIDOCAINE VISCOUS HCL 2 % MT SOLN
15.0000 mL | Freq: Once | OROMUCOSAL | Status: AC
  Administered 2020-03-21: 15 mL via OROMUCOSAL
  Filled 2020-03-21: qty 15

## 2020-03-21 MED ORDER — AMOXICILLIN 500 MG PO CAPS: 500.0000 mg | ORAL_CAPSULE | Freq: Three times a day (TID) | ORAL | 0 refills | Status: DC

## 2020-03-21 MED ORDER — NAPROXEN 500 MG PO TABS
500.0000 mg | ORAL_TABLET | Freq: Once | ORAL | Status: AC
  Administered 2020-03-21: 500 mg via ORAL
  Filled 2020-03-21: qty 1

## 2020-03-21 MED ORDER — NAPROXEN 500 MG PO TABS: 500.0000 mg | ORAL_TABLET | Freq: Two times a day (BID) | ORAL | Status: DC
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
## 2020-03-21 NOTE — Discharge Instructions (Signed)
Follow discharge care instruction take medication as directed.  Follow-up with treating dentist or choose from list of dental clinics in your discharge care instruction.  OPTIONS FOR DENTAL FOLLOW UP CARE  [place] Department of Health and Human Services - Local Safety Net Dental Clinics [email or web address]   [facility] [phone])  Sharl Ma [phone])  [place] [phone])  [facility] [phone])  [facility] [phone]) This clinic caters to the indigent population and is on a lottery system. Location: [employer] [facility], [employer], [address] [place] Clinic Hours: Wednesdays from 6pm - 9pm, patients seen by a lottery system. For dates, call or go to [email or web address] Services: Cleanings, fillings and simple extractions. Payment Options: DENTAL WORK IS FREE OF CHARGE. Bring proof of income or support. Best way to get seen: Arrive at 5:15 pm - this is a lottery, NOT first come/first serve, so arriving earlier will not increase your chances of being seen.     [facility] [phone] Select option 1 for emergencies   Location: [facility], [place], [address], [place] Clinic Hours: No walk-ins accepted - call the day before to schedule an appointment. Check in times are 9:30 am and 1:30 pm. Services: Simple extractions, temporary fillings, pulpectomy/pulp debridement, uncomplicated abscess drainage. Payment Options: PAYMENT IS DUE AT THE TIME OF SERVICE.  Fee is usually $100-200, additional surgical procedures (e.g. abscess drainage) may be extra. Cash, checks, Visa/MasterCard accepted.  Can file Medicaid if patient is covered for dental - patient should call case worker to check. No discount for [facility] patients. Best way to get  seen: MUST call the day before and get onto the schedule. Can usually be seen the next 1-2 days. No walk-ins accepted.     [facility] [phone]   Location: [facility], [address], [place] Clinic Hours: M, W, Th, F 8am or 1:30pm, Tues 9a or 1:30 - first come/first served. Services: Simple extractions, temporary fillings, uncomplicated abscess drainage.  You do not need to be an [facility] resident. Payment Options: PAYMENT IS DUE AT THE TIME OF SERVICE. Dental insurance, otherwise sliding scale - bring proof of income or support. Depending on income and treatment needed, cost is usually $50-200. Best way to get seen: Arrive early as it is first come/first served.     [facility] [phone]   Location: [address] Clinic Hours: Mon-Thu 8a-5p Services: Most basic dental services including extractions and fillings. Payment Options: PAYMENT IS DUE AT THE TIME OF SERVICE. Sliding scale, up to 50% off - bring proof if income or support. Medicaid with dental option accepted. Best way to get seen: Call to schedule an appointment, can usually be seen within 2 weeks OR they will try to see walk-ins - show up at 8a or 2p (you may have to wait).     [facility] [phone] [place] RESIDENTS ONLY   Location: [facility], [address], [place], [place] [address] Clinic Hours: By appointment only. Monday - Thursday 8am-5pm, Friday 8am-12pm Services: Cleanings, fillings, extractions. Payment Options: PAYMENT IS DUE AT THE TIME OF SERVICE. Cash, Visa or MasterCard. Sliding scale - $30 minimum per service. Best way to get seen: Come in to office, complete packet and make an appointment - need proof of income or support monies for each household member and proof of [employer]  [place] residence. Usually takes about a month to get in.     [facility] [phone]   Location: [address] [place] Clinic Hours: Walk-in Urgent Care Dental Services are offered Monday-Friday mornings only. The numbers of emergencies accepted daily is limited to the number of providers available. Maximum 15 - Mondays, Wednesdays & Thursdays Maximum 10 - Tuesdays & Fridays Services: You do not need to be a [facility] resident to be seen for a dental emergency. Emergencies are defined as pain, swelling, abnormal bleeding, or dental trauma. Walkins will receive x-rays if needed. NOTE: Dental cleaning is not an emergency. Payment Options: PAYMENT IS DUE AT THE TIME OF SERVICE. Minimum co-pay is $40.00 for uninsured patients. Minimum co-pay is $3.00 for Medicaid with dental coverage. Dental Insurance is accepted and must be presented at time of visit. Medicare does not cover dental. Forms of payment: Cash, credit card, checks. Best way to get seen: If not previously registered with the clinic, walk-in dental registration begins at 7:15 am and is on a first come/first serve basis. If previously registered with the clinic, call to make an appointment.     [facility] [place] ONLY   Location: [address], [place], [place] Clinic Hours: Mon-Thu 10a-2p Services: Extractions only! Payment Options: FREE (donations accepted) - bring proof of income or support Best way to get seen: Call and schedule an appointment OR come at 8am on the 1st Monday of every month (except for holidays) when it is first come/first served.     [facility] [phone]   Location: [place], [place] Clinic Hours: Friday mornings Services, Payment Options, Best way to get seen: Call for info

## 2020-04-19 ENCOUNTER — Other Ambulatory Visit: Payer: Self-pay

## 2020-04-19 ENCOUNTER — Emergency Department
Admission: EM | Admit: 2020-04-19 | Discharge: 2020-04-19 | Disposition: A | Discharge status: Home / Self Care | Payer: Medicaid Other | Attending: Emergency Medicine | Admitting: Emergency Medicine

## 2020-04-19 DIAGNOSIS — K0889 Other specified disorders of teeth and supporting structures: Secondary | ICD-10-CM | POA: Insufficient documentation

## 2020-04-19 MED ORDER — CLINDAMYCIN HCL 150 MG PO CAPS: ORAL_CAPSULE | ORAL | 0 refills | Status: DC

## 2020-04-19 MED ORDER — LIDOCAINE VISCOUS HCL 2 % MT SOLN: 5.0000 mL | Freq: Four times a day (QID) | OROMUCOSAL | 0 refills | Status: DC | PRN
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
## 2020-04-19 NOTE — ED Triage Notes (Signed)
Pt c/o left lower dental pain , states he has been here a few times for the same issue.

## 2020-04-19 NOTE — Discharge Instructions (Addendum)
Get the medication that was sent to your pharmacy and take until finished.  A list of clinics is on your discharge papers.  You will need to pick one as the ER does not have a dentist  [place]  [place] Department of Health and [place] [email or web address] [phone])  [facility] [phone])  [place] [phone])  [place] [phone])  [facility] [phone]) This clinic caters to the indigent population and is on a lottery system. Location: [employer] [facility], [employer], [place], [place] Clinic Hours: Wednesdays from 6pm - 9pm, patients seen by a lottery system. For dates, call or go to [email or web address] Services: Cleanings, fillings and simple extractions. Payment Options: DENTAL WORK IS FREE OF CHARGE. Bring proof of income or support. Best way to get seen: Arrive at 5:15 pm - this is a lottery, NOT first come/first serve, so arriving earlier will not increase your chances of being seen.     [facility] [place] [facility] [place] [phone] Select option 1 for emergencies   Location: [facility], [place], [address] [place] Clinic Hours: No walk-ins accepted - call the day before to schedule an appointment. Check in times are 9:30 am and 1:30 pm. Services: Simple extractions, temporary fillings, pulpectomy/pulp debridement, uncomplicated abscess drainage. Payment Options: PAYMENT IS DUE AT THE TIME OF SERVICE.  Fee is usually $100-200, additional surgical procedures (e.g. abscess drainage) may be extra. Cash, checks, Visa/MasterCard accepted.  Can file Medicaid if patient is covered for dental - patient should call case worker to check. No discount for [facility] patients. Best way  to get seen: MUST call the day before and get onto the schedule. Can usually be seen the next 1-2 days. No walk-ins accepted.     [place] [phone]   Location: [place], [place] Clinic Hours: M, W, Th, F 8am or 1:30pm, Tues 9a or 1:30 - first come/first served. Services: Simple extractions, temporary fillings, uncomplicated abscess drainage.  You do not need to be an [facility] resident. Payment Options: PAYMENT IS DUE AT THE TIME OF SERVICE. Dental insurance, otherwise sliding scale - bring proof of income or support. Depending on income and treatment needed, cost is usually $50-200. Best way to get seen: Arrive early as it is first come/first served.     [facility] [phone]   Location: [place] Clinic Hours: Mon-Thu 8a-5p Services: Most basic dental services including extractions and fillings. Payment Options: PAYMENT IS DUE AT THE TIME OF SERVICE. Sliding scale, up to 50% off - bring proof if income or support. Medicaid with dental option accepted. Best way to get seen: Call to schedule an appointment, can usually be seen within 2 weeks OR they will try to see walk-ins - show up at [place] or 2p (you may have to wait).     [facility] [place] RESIDENTS ONLY   Location: [facility], [place] [address] [place], [place] [address] Clinic Hours: By appointment only. Monday - Thursday 8am-5pm, Friday 8am-12pm Services: Cleanings, fillings, extractions. Payment Options: PAYMENT IS DUE AT THE TIME OF SERVICE. Cash, Visa or MasterCard. Sliding scale - $30 minimum per service. Best way to get seen: Come in to office, complete packet and make an appointment - need proof  of income or support monies for each household member and proof of [facility] residence. Usually takes about a month to get in.      [facility] [phone]   Location: [address] [facility] Hours: Walk-in Urgent Care Dental Services are offered Monday-Friday mornings only. The numbers of emergencies accepted daily is limited to the number of providers available. Maximum 15 - Mondays, Wednesdays & Thursdays Maximum 10 - Tuesdays & Fridays Services: You do not need to be a [facility] resident to be seen for a dental emergency. Emergencies are defined as pain, swelling, abnormal bleeding, or dental trauma. Walkins will receive x-rays if needed. NOTE: Dental cleaning is not an emergency. Payment Options: PAYMENT IS DUE AT THE TIME OF SERVICE. Minimum co-pay is $40.00 for uninsured patients. Minimum co-pay is $3.00 for Medicaid with dental coverage. Dental Insurance is accepted and must be presented at time of visit. Medicare does not cover dental. Forms of payment: Cash, credit card, checks. Best way to get seen: If not previously registered with the clinic, walk-in dental registration begins at 7:15 am and is on a first come/first serve basis. If previously registered with the clinic, call to make an appointment.     [facility] [phone] [place] RESIDENTS ONLY   Location: [address], [place], [place] Clinic Hours: Mon-Thu 10a-2p Services: Extractions only! Payment Options: FREE (donations accepted) - bring proof of income or support Best way to get seen: Call and schedule an appointment OR come at 8am on the 1st Monday of every month (except for holidays) when it is first come/first served.     [facility] [phone]   Location: [address] [place], [place] Clinic Hours: Friday mornings Services, Payment Options, Best way to get seen: Call for info

## 2020-05-14 ENCOUNTER — Other Ambulatory Visit: Payer: Self-pay

## 2020-05-14 ENCOUNTER — Ambulatory Visit: Payer: Self-pay | Admitting: Physician Assistant

## 2020-05-14 DIAGNOSIS — Z202 Contact with and (suspected) exposure to infections with a predominantly sexual mode of transmission: Secondary | ICD-10-CM

## 2020-05-14 DIAGNOSIS — Z113 Encounter for screening for infections with a predominantly sexual mode of transmission: Secondary | ICD-10-CM

## 2020-05-14 MED ORDER — CEFTRIAXONE SODIUM 500 MG IJ SOLR
500.0000 mg | Freq: Once | INTRAMUSCULAR | Status: AC
  Administered 2020-05-14: 500 mg via INTRAMUSCULAR

## 2020-05-14 MED ORDER — AZITHROMYCIN 500 MG PO TABS
1000.0000 mg | ORAL_TABLET | Freq: Once | ORAL | Status: AC
  Administered 2020-05-14: 1000 mg via ORAL

## 2020-05-14 NOTE — Progress Notes (Signed)
Patient treated per provider orders as contact. Gram stain reviewed.Burt Knack, RN

## 2020-05-15 ENCOUNTER — Encounter: Payer: Self-pay | Admitting: Physician Assistant

## 2020-05-15 LAB — GRAM STAIN

## 2020-05-15 NOTE — Progress Notes (Signed)
° °  [facility] Department STI clinic/screening visit  Subjective:  Gabriel Baker is a 24 y.o. male being seen today for an STI screening visit. The patient reports they do not have symptoms.    Patient has the following medical conditions:   Patient Active Problem List   Diagnosis Date Noted   Eczema 02/25/2016     Chief Complaint  Patient presents with   SEXUALLY TRANSMITTED DISEASE    screening    HPI  Patient reports that he is not having symptoms but is a contact to GC and Chlamydia.  Denies chronic conditions and regular medicines.  Per patient, last HIV test was 12/2019.   See flowsheet for further details and programmatic requirements.    The following portions of the patient's history were reviewed and updated as appropriate: allergies, current medications, past medical history, past social history, past surgical history and problem list.  Objective:  There were no vitals filed for this visit.  Physical Exam Constitutional:      General: He is not in acute distress.    Appearance: Normal appearance.  HENT:     Head: Normocephalic and atraumatic.     Comments: No nits, lice, or hair loss. No cervical, supraclavicular or axillary adenopathy.    Mouth/Throat:     Mouth: Mucous membranes are moist.     Pharynx: Oropharynx is clear. No oropharyngeal exudate or posterior oropharyngeal erythema.  Eyes:     Conjunctiva/sclera: Conjunctivae normal.  Pulmonary:     Effort: Pulmonary effort is normal.  Abdominal:     Palpations: Abdomen is soft. There is no mass.     Tenderness: There is no abdominal tenderness. There is no guarding or rebound.  Genitourinary:    Penis: Normal.      Testes: Normal.     Comments: Pubic area without nits, lice, edema, erythema. Penis circumcised, without rash, lesions and inguinal adenopathy. Musculoskeletal:     Cervical back: Neck supple. No tenderness.  Skin:    General: Skin is warm and dry.     Findings: No  bruising, erythema, lesion or rash.  Neurological:     Mental Status: He is alert and oriented to person, place, and time.  Psychiatric:        Mood and Affect: Mood normal.        Behavior: Behavior normal.        Thought Content: Thought content normal.        Judgment: Judgment normal.       Assessment and Plan:  Gabriel Baker is a 24 y.o. male presenting to the [facility] Department for STI screening  1. Screening for STD (sexually transmitted disease) Patient into clinic without symptoms. Rec condoms with all sex. Await test results.  Counseled that RN will call if needs to RTC for treatment once results are back. - Gram stain - Gonococcus culture - HIV [place] [facility] - Syphilis Serology, [place] [facility]  2. Venereal disease contact Patient to be treated as a contact to GC and Chlamydia with Ceftriaxone 500mg  IM and Azithromycin 1g po DOT today. No sex for 7 days and until after partner completes treatment. RTC for re-treatment if vomits < 2 after taking medicines. - cefTRIAXone (ROCEPHIN) injection 500 mg - azithromycin (ZITHROMAX) tablet 1,000 mg     No follow-ups on file.  No future appointments.  , PA

## 2020-05-19 LAB — GONOCOCCUS CULTURE
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
## 2020-12-20 ENCOUNTER — Other Ambulatory Visit: Payer: Self-pay

## 2020-12-20 ENCOUNTER — Ambulatory Visit: Payer: Self-pay | Admitting: Physician Assistant

## 2020-12-20 DIAGNOSIS — Z113 Encounter for screening for infections with a predominantly sexual mode of transmission: Secondary | ICD-10-CM

## 2020-12-20 NOTE — Progress Notes (Signed)
Gram stain reviewed; no tx per SO Richmond Campbell, RN

## 2020-12-21 LAB — GRAM STAIN

## 2020-12-22 ENCOUNTER — Encounter: Payer: Self-pay | Admitting: Physician Assistant

## 2020-12-22 NOTE — Progress Notes (Signed)
[facility] Department STI clinic/screening visit  Subjective:  Gabriel Baker is a 25 y.o. male being seen today for an STI screening visit. The patient reports they do have symptoms.    Patient has the following medical conditions:   Patient Active Problem List   Diagnosis Date Noted  . Eczema 02/25/2016  . Acne 06/03/2012     Chief Complaint  Patient presents with  . SEXUALLY TRANSMITTED DISEASE    screening    HPI  Patient reports that he is concerned about dry skin on his penis.  Denies other symptoms, chronic conditions and regular medicines.  States last HIV test was 04/2020 and last void prior to sample collection for Gram stain was over 2 hr ago.  Reports that he did recently change both soap/body wash and detergent he washes clothes with since moving.     See flowsheet for further details and programmatic requirements.    The following portions of the patient's history were reviewed and updated as appropriate: allergies, current medications, past medical history, past social history, past surgical history and problem list.  Objective:  There were no vitals filed for this visit.  Physical Exam Constitutional:      General: He is not in acute distress.    Appearance: Normal appearance.  HENT:     Head: Normocephalic and atraumatic.     Comments: No nits,lice, or hair loss. No cervical, supraclavicular or axillary adenopathy.    Mouth/Throat:     Mouth: Mucous membranes are moist.     Pharynx: Oropharynx is clear. No oropharyngeal exudate or posterior oropharyngeal erythema.  Eyes:     Conjunctiva/sclera: Conjunctivae normal.  Pulmonary:     Effort: Pulmonary effort is normal.  Abdominal:     Palpations: Abdomen is soft. There is no mass.     Tenderness: There is no abdominal tenderness. There is no guarding or rebound.  Genitourinary:    Penis: Normal.      Testes: Normal.     Comments: Pubic area without nits, lice, hair loss, edema,  erythema, lesions and inguinal adenopathy. Penis circumcised without rash, lesions and discharge at meatus.  Penile shaft with slight scaling but no flaking of skin. Musculoskeletal:     Cervical back: Neck supple. No tenderness.  Skin:    General: Skin is warm and dry.     Findings: No bruising, erythema, lesion or rash.  Neurological:     Mental Status: He is alert and oriented to person, place, and time.  Psychiatric:        Mood and Affect: Mood normal.        Behavior: Behavior normal.        Thought Content: Thought content normal.        Judgment: Judgment normal.       Assessment and Plan:  TOU HAYNER is a 25 y.o. male presenting to the [facility] Department for STI screening  1. Screening for STD (sexually transmitted disease) Patient into clinic with symptoms. Reviewed with patient that he should likely use fragrance and dye free soap/body wash and detergent to prevent skin irritation. Enc patient to use fragrance and dye free moisturizing lotion or cream on skin. Rec condoms with all sex. Await test results.  Counseled that RN will call if needs to RTC for treatment once results are back. - Gram stain - Gonococcus culture - HIV [place] [facility] - Syphilis Serology, [place] [facility]     No follow-ups on  file.  No future appointments.  Jerene Dilling, PA

## 2020-12-25 LAB — GONOCOCCUS CULTURE

## 2020-12-26 ENCOUNTER — Encounter: Payer: Self-pay | Admitting: Student

## 2020-12-26 LAB — HM HIV SCREENING LAB: HM HIV Screening: NEGATIVE

## 2021-06-07 ENCOUNTER — Encounter: Payer: Self-pay | Admitting: Physician Assistant

## 2021-06-07 ENCOUNTER — Ambulatory Visit: Payer: Self-pay | Admitting: Physician Assistant

## 2021-06-07 ENCOUNTER — Other Ambulatory Visit: Payer: Self-pay

## 2021-06-07 DIAGNOSIS — Z3009 Encounter for other general counseling and advice on contraception: Secondary | ICD-10-CM | POA: Diagnosis not present

## 2021-06-07 DIAGNOSIS — Z202 Contact with and (suspected) exposure to infections with a predominantly sexual mode of transmission: Secondary | ICD-10-CM

## 2021-06-07 DIAGNOSIS — Z1388 Encounter for screening for disorder due to exposure to contaminants: Secondary | ICD-10-CM | POA: Diagnosis not present

## 2021-06-07 DIAGNOSIS — Z113 Encounter for screening for infections with a predominantly sexual mode of transmission: Secondary | ICD-10-CM

## 2021-06-07 DIAGNOSIS — Z0389 Encounter for observation for other suspected diseases and conditions ruled out: Secondary | ICD-10-CM | POA: Diagnosis not present

## 2021-06-07 LAB — GRAM STAIN

## 2021-06-07 MED ORDER — PENICILLIN G BENZATHINE 1200000 UNIT/2ML IM SUSY
2.4000 10*6.[IU] | PREFILLED_SYRINGE | Freq: Once | INTRAMUSCULAR | Status: AC
  Administered 2021-06-07: 2.4 10*6.[IU] via INTRAMUSCULAR

## 2021-06-07 NOTE — Progress Notes (Signed)
[facility] Department STI clinic/screening visit  Subjective:  Gabriel Baker is a 25 y.o. male being seen today for an STI screening visit. The patient reports they do not have symptoms.    Patient has the following medical conditions:   Patient Active Problem List   Diagnosis Date Noted   Eczema 02/25/2016   Acne 06/03/2012     Chief Complaint  Patient presents with   SEXUALLY TRANSMITTED DISEASE    screening    HPI  Patient reports that he is not having any symptoms but is a contact to "something that starts with a S. I talked with Blima Singer and she told me to come get checked."  Denies chronic conditions, surgeries and regular medicines.  Reports last HIV test was in February and last void prior to sample collection for Gram stain was 2 hr ago.;   See flowsheet for further details and programmatic requirements.    The following portions of the patient's history were reviewed and updated as appropriate: allergies, current medications, past medical history, past social history, past surgical history and problem list.  Objective:  There were no vitals filed for this visit.  Physical Exam Constitutional:      General: He is not in acute distress.    Appearance: Normal appearance.  HENT:     Head: Normocephalic and atraumatic.     Comments: No nits,lice, or hair loss. No cervical, supraclavicular or axillary adenopathy.     Mouth/Throat:     Mouth: Mucous membranes are moist.     Pharynx: Oropharynx is clear. No oropharyngeal exudate or posterior oropharyngeal erythema.  Eyes:     Conjunctiva/sclera: Conjunctivae normal.  Pulmonary:     Effort: Pulmonary effort is normal.  Abdominal:     Palpations: Abdomen is soft. There is no mass.     Tenderness: There is no abdominal tenderness. There is no guarding or rebound.  Genitourinary:    Penis: Normal.      Testes: Normal.     Comments: Pubic area without nits, lice, hair loss, edema, erythema, lesions  and inguinal adenopathy. Penis circumcised without rash, lesions and discharge at meatus. Testicles descended bilaterally,nt, no masses or edema.  Musculoskeletal:     Cervical back: Neck supple. No tenderness.  Skin:    General: Skin is warm and dry.     Findings: No bruising, erythema, lesion or rash.     Comments: Patient with hyperpigmented acne scarring on face and arms as well as signs of excoriation from eczema at elbows.  Neurological:     Mental Status: He is alert and oriented to person, place, and time.  Psychiatric:        Mood and Affect: Mood normal.        Behavior: Behavior normal.        Thought Content: Thought content normal.        Judgment: Judgment normal.      Assessment and Plan:  Gabriel Baker is a 25 y.o. male presenting to the [facility] Department for STI screening  1. Screening for STD (sexually transmitted disease) Patient into clinic without symptoms. Reviewed Gram stain results with patient. Rec condoms with all sex. Await test results.  Counseled that RN will call if needs to RTC for treatment once results are back.  - Gram stain - Gonococcus culture - HIV [place] [facility] - Syphilis Serology, [place] [facility]  2. Syphilis contact Will treat as a contact to Syphilis with Bicillin  2.4 mu IM today. Review with patient normal SE of injections and that he should use OTC analgesics and warm compresses to relieve SE. No sex for 14 days and until after lab results are back. Call with questions or concerns. - penicillin g benzathine (BICILLIN LA) 1200000 UNIT/2ML injection 2.4 Million Units     No follow-ups on file.  No future appointments.  Matt Holmes, PA

## 2021-06-11 LAB — GONOCOCCUS CULTURE

## 2021-06-13 LAB — HM HIV SCREENING LAB: HM HIV Screening: NEGATIVE

## 2021-12-11 ENCOUNTER — Ambulatory Visit: Payer: Medicaid Other

## 2021-12-17 ENCOUNTER — Ambulatory Visit: Payer: Self-pay | Admitting: Family Medicine

## 2021-12-17 ENCOUNTER — Other Ambulatory Visit: Payer: Self-pay

## 2021-12-17 ENCOUNTER — Encounter: Payer: Self-pay | Admitting: Family Medicine

## 2021-12-17 DIAGNOSIS — Z113 Encounter for screening for infections with a predominantly sexual mode of transmission: Secondary | ICD-10-CM

## 2021-12-17 DIAGNOSIS — Z202 Contact with and (suspected) exposure to infections with a predominantly sexual mode of transmission: Secondary | ICD-10-CM

## 2021-12-17 LAB — GRAM STAIN

## 2021-12-17 MED ORDER — METRONIDAZOLE 500 MG PO TABS: 2000.0000 mg | ORAL_TABLET | Freq: Once | ORAL | 0 refills | Status: AC

## 2021-12-17 NOTE — Progress Notes (Signed)
[facility] Department STI clinic/screening visit  Subjective:  Gabriel Baker is a 26 y.o. male being seen today for an STI screening visit. The patient reports they do not have symptoms.    Patient has the following medical conditions:   Patient Active Problem List   Diagnosis Date Noted   Eczema 02/25/2016   Acne 06/03/2012     Chief Complaint  Patient presents with   SEXUALLY TRANSMITTED DISEASE    screening    HPI  Patient reports here for screening, reports contact to [facility]   Does the patient or their partner desires a pregnancy in the next year? No  Screening for MPX risk: Does the patient have an unexplained rash? No Is the patient MSM? No Does the patient endorse multiple sex partners or anonymous sex partners? Yes Did the patient have close or sexual contact with a person diagnosed with MPX? No Has the patient traveled outside the [place] where MPX is endemic? No Is there a high clinical suspicion for MPX-- evidenced by one of the following No  -Unlikely to be chickenpox  -Lymphadenopathy  -Rash that present in same phase of evolution on any given body part   See flowsheet for further details and programmatic requirements.    The following portions of the patient's history were reviewed and updated as appropriate: allergies, current medications, past medical history, past social history, past surgical history and problem list.  Objective:  There were no vitals filed for this visit.  Physical Exam Constitutional:      Appearance: Normal appearance.  HENT:     Head: Normocephalic.     Mouth/Throat:     Mouth: Mucous membranes are moist.     Pharynx: Oropharynx is clear. No oropharyngeal exudate.  Pulmonary:     Effort: Pulmonary effort is normal.  Genitourinary:    Penis: Normal.      Testes: Normal.     Comments: No lice, nits, or pest, no lesions or odor discharge.  Denies pain or tenderness with paplation of testicles.  No lesions,  ulcers or masses present.    Musculoskeletal:     Cervical back: Normal range of motion.  Lymphadenopathy:     Cervical: No cervical adenopathy.  Skin:    General: Skin is warm and dry.     Findings: No bruising, erythema, lesion or rash.  Neurological:     Mental Status: He is alert and oriented to person, place, and time.  Psychiatric:        Mood and Affect: Mood normal.        Behavior: Behavior normal.      Assessment and Plan:  Gabriel Baker is a 26 y.o. male presenting to the [facility] Department for STI screening  1. Screening examination for venereal disease Patient does not have STI symptoms Patient accepted all screenings including  gram stain,  urethral GC and declined bloodwork for HIV/RPR.  Patient meets criteria for HepB screening? Yes. Ordered? No - declined  Patient meets criteria for HepC screening? Yes. Ordered? No - declined  Recommended condom use with all sex Discussed importance of condom use for STI prevent  Treat gram stain per standing order Discussed time line for [facility] results and that patient will be called with positive results and encouraged patient to call if he had not heard in 2 weeks Recommended returning for continued or worsening symptoms.   - Gram stain - Gonococcus culture  2. Exposure to trichomonas Pt treated for  exposure to Trich  - metroNIDAZOLE (FLAGYL) 500 MG tablet; Take 4 tablets (2,000 mg total) by mouth once for 1 dose.  Dispense: 4 tablet; Refill: 0   No follow-ups on file.  No future appointments.  Junious Dresser, FNP

## 2021-12-22 LAB — GONOCOCCUS CULTURE

## 2022-02-07 ENCOUNTER — Emergency Department
Admission: EM | Admit: 2022-02-07 | Discharge: 2022-02-07 | Disposition: A | Discharge status: Home / Self Care | Payer: Medicaid Other | Attending: Emergency Medicine | Admitting: Emergency Medicine

## 2022-02-07 ENCOUNTER — Other Ambulatory Visit: Payer: Self-pay

## 2022-02-07 DIAGNOSIS — R109 Unspecified abdominal pain: Secondary | ICD-10-CM

## 2022-02-07 DIAGNOSIS — R1010 Upper abdominal pain, unspecified: Secondary | ICD-10-CM | POA: Insufficient documentation

## 2022-02-07 NOTE — ED Notes (Signed)
See triage note  presents with some abd pain which he had yesterday. Denies any pain at present  no n/v/d/ or fever ?
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
## 2022-02-07 NOTE — ED Triage Notes (Signed)
Patient to ER via POV, reports he skipped lunch yesterday and his stomach started hurting. Patient reports telling his boss, who wanted him to be checked out. Patient reports feeling better today.  ?

## 2022-02-14 ENCOUNTER — Other Ambulatory Visit: Payer: Self-pay

## 2022-02-14 ENCOUNTER — Emergency Department
Admission: EM | Admit: 2022-02-14 | Discharge: 2022-02-14 | Disposition: A | Discharge status: Home / Self Care | Payer: Medicaid Other | Attending: Emergency Medicine | Admitting: Emergency Medicine

## 2022-02-14 DIAGNOSIS — K029 Dental caries, unspecified: Secondary | ICD-10-CM | POA: Insufficient documentation

## 2022-02-14 MED ORDER — AMOXICILLIN 875 MG PO TABS: 875.0000 mg | ORAL_TABLET | Freq: Two times a day (BID) | ORAL | 0 refills | Status: DC

## 2022-02-14 NOTE — ED Provider Notes (Signed)
? ?[facility] ?Provider Note ? ? ? Event Date/Time  ? First MD Initiated Contact with Patient 02/14/22 [phone]   ?  (approximate) ? ? ?History  ? ?Dental Pain ? ? ?HPI ? ?Gabriel Baker is a 26 y.o. male to the ED with complaint of right lower dental pain.  Patient states that he was at work and ate some Doritos when he felt his tooth started hurting.  Patient states that he had ibuprofen and has taken about 10 over-the-counter tablets for his dental pain.  Patient does smoke. ?  ? ? ?Physical Exam  ? ?Triage Vital Signs: ?ED Triage Vitals  ?Enc Vitals Group  ?   BP 02/14/22 0936 (!) 149/80  ?   Pulse Rate 02/14/22 0936 78  ?   Resp 02/14/22 0936 17  ?   Temp 02/14/22 0936 98 ?F (36.7 ?C)  ?   Temp src --   ?   SpO2 02/14/22 0936 100 %  ?   Weight 02/14/22 0950 150 lb (68 kg)  ?   Height 02/14/22 0950 5\' 7"  (1.702 m)  ?   Head Circumference --   ?   Peak Flow --   ?   Pain Score 02/14/22 0935 8  ?   Pain Loc --   ?   Pain Edu? --   ?   Excl. in GC? --   ? ? ?Most recent vital signs: ?Vitals:  ? 02/14/22 0936  ?BP: (!) 149/80  ?Pulse: 78  ?Resp: 17  ?Temp: 98 ?F (36.7 ?C)  ?SpO2: 100%  ? ? ? ?General: Awake, no distress.  ?CV:  Good peripheral perfusion.  Heart regular rate and rhythm. ?Resp:  Normal effort.  Clear bilaterally. ?Abd:  No distention.  ?Other:  Left lower molar in poor repair with large cary  present and outer shell still intact.  Area is tender to palpation.  No obvious abscess noted however tooth is in such poor repair. ? ? ?ED Results / Procedures / Treatments  ? ?Labs ?(all labs ordered are listed, but only abnormal results are displayed) ?Labs Reviewed - No data to display ? ? ?PROCEDURES: ? ?Critical Care performed:  ? ?Procedures ? ? ?MEDICATIONS ORDERED IN ED: ?Medications - No data to display ? ? ?IMPRESSION / MDM / ASSESSMENT AND PLAN / ED COURSE  ?I reviewed the triage vital signs and the nursing notes. ? ? ?Differential diagnosis includes, but is not limited to,  dental pain, dental abscess, gingivitis. ? ?26 year old male presents to the ED with complaint of right lower dental pain that worsened today while he was eating some Doritos.  Patient also has taken 10 over-the-counter ibuprofen tablets this morning.  He was made aware that this was definitely too much ibuprofen at 1 time and he is advised not to take any more ibuprofen for the entire day.  He is encouraged to eat food immediately when he leaves the ED and chew on the opposite side.  A list of dental clinics was given to him to call for repair.  Amoxicillin 875 twice daily was sent to the pharmacy.  He also requested a note to remain out of work. ? ? ? ?  ? ? ?FINAL CLINICAL IMPRESSION(S) / ED DIAGNOSES  ? ?Final diagnoses:  ?Pain due to dental caries  ? ? ? ?Rx / DC Orders  ? ?ED Discharge Orders   ? ?      Ordered  ?  amoxicillin (AMOXIL) 875 MG  tablet  2 times daily       ? 02/14/22 1019  ? ?  ?  ? ?  ? ? ? ?Note:  This document was prepared using Dragon voice recognition software and may include unintentional dictation errors. ?  ?Tommi Rumps, PA-C ?02/14/22 1258 ? ?  ?Sharyn Creamer, MD ?02/14/22 1601 ? ?

## 2022-02-14 NOTE — ED Triage Notes (Signed)
Pt come with c/o dental pain. Pt states bad tooth on right side. Pt states this just started today. ?

## 2022-02-14 NOTE — Discharge Instructions (Addendum)
Taking antibiotic until completely finished.  Do not take ibuprofen in the amount that you have already taken.  You do not need any more ibuprofen today.  You may take some tomorrow beginning with 3 tablets with food 4 times a day if needed.  A list of dental clinics is listed on your discharge papers. ? ?OPTIONS FOR DENTAL FOLLOW UP CARE ? ?[place] [facility] and [facility] - Local Safety Net Dental Clinics ?[email or web address] ?  ?[facility] [phone]) ? ?[facility] [phone]) ? ?[facility] [phone]) ? ?[facility] [phone]) ? ?[facility] [phone]) ?This clinic caters to the indigent population and is on a lottery system. ?Location: ?[employer] [facility], [employer], [address], [place] ?Clinic Hours: ?Wednesdays from 6pm - 9pm, patients seen by a lottery system. ?For dates, call or go to [email or web address] ?Services: ?Cleanings, fillings and simple extractions. ?Payment Options: ?DENTAL WORK IS FREE OF CHARGE. Bring proof of income or support. ?Best way to get seen: ?Arrive at 5:15 pm - this is a lottery, NOT first come/first serve, so arriving earlier will not increase your chances of being seen. ?  ?  ?[facility] ?[phone] ?Select option 1 for emergencies ?  ?Location: ?[employer] [facility], [employer], [address], [place] ?Clinic Hours: ?No walk-ins accepted - call the day before to schedule an appointment. ?Check in times are 9:30 am and 1:30 pm. ?Services: ?Simple extractions, temporary fillings, pulpectomy/pulp debridement, uncomplicated abscess drainage. ?Payment Options: ?PAYMENT IS DUE AT THE TIME OF SERVICE.  Fee is usually $100-200, additional surgical procedures (e.g. abscess drainage) may be extra. ?Cash, checks, Visa/MasterCard accepted.  Can file Medicaid if patient  is covered for dental - patient should call case worker to check. ?No discount for [facility] patients. ?Best way to get seen: ?MUST call the day before and get onto the schedule. Can usually be seen the next 1-2 days. No walk-ins accepted. ?  ?  ?[facility] ?[phone] ?  ?Location: ?[facility], [address] [place] ?Clinic Hours: ?M, W, Th, F 8am or 1:30pm, Tues 9a or 1:30 - first come/first served. ?Services: ?Simple extractions, temporary fillings, uncomplicated abscess drainage.  You do not need to be an [facility] resident. ?Payment Options: ?PAYMENT IS DUE AT THE TIME OF SERVICE. ?Dental insurance, otherwise sliding scale - bring proof of income or support. ?Depending on income and treatment needed, cost is usually $50-200. ?Best way to get seen: ?Arrive early as it is first come/first served. ?  ?  ?[facility] ?[phone] ?  ?Location: ?[address] ?Clinic Hours: ?Mon-Thu 8a-5p ?Services: ?Most basic dental services including extractions and fillings. ?Payment Options: ?PAYMENT IS DUE AT THE TIME OF SERVICE. ?Sliding scale, up to 50% off - bring proof if income or support. ?Medicaid with dental option accepted. ?Best way to get seen: ?Call to schedule an appointment, can usually be seen within 2 weeks OR they will try to see walk-ins - show up at 8a or 2p (you may have to wait). ?  ?  ?[facility] ?[phone] ?[place] RESIDENTS ONLY ?  ?Location: ?[facility] [employer], [address] [place], [place] [address] ?Clinic Hours: By appointment only. ?Monday - Thursday 8am-5pm, Friday 8am-12pm ?Services: Cleanings, fillings, extractions. ?Payment Options: ?PAYMENT IS DUE AT THE TIME OF SERVICE. ?Cash, Visa or MasterCard. Sliding scale - $30 minimum per  service. ?Best way to get seen: ?Come in to office, complete packet and make an appointment - need proof of income ?or support  monies for each household member and proof of [facility] residence. ?Usually takes about a month to get in. ?  ?  ?[facility] ?[phone] ?  ?Location: ?[address], [facility] ?Clinic Hours: Walk-in Urgent Care Dental Services are offered Monday-Friday mornings only. ?The numbers of emergencies accepted daily is limited to the number of ?providers available. ?Maximum 15 - Mondays, Wednesdays & Thursdays ?Maximum 10 - Tuesdays & Fridays ?Services: ?You do not need to be a [facility] resident to be seen for a dental emergency. ?Emergencies are defined as pain, swelling, abnormal bleeding, or dental trauma. Walkins will receive x-rays if needed. ?NOTE: Dental cleaning is not an emergency. ?Payment Options: ?PAYMENT IS DUE AT THE TIME OF SERVICE. ?Minimum co-pay is $40.00 for uninsured patients. ?Minimum co-pay is $3.00 for Medicaid with dental coverage. ?Dental Insurance is accepted and must be presented at time of visit. ?Medicare does not cover dental. ?Forms of payment: Cash, credit card, checks. ?Best way to get seen: ?If not previously registered with the clinic, walk-in dental registration begins at 7:15 am and is on a first come/first serve basis. ?If previously registered with the clinic, call to make an appointment. ?  ?  ?[facility] ?[phone] ?[place] RESIDENTS ONLY ?  ?Location: ?[address], [place], [place] ?Clinic Hours: ?Mon-Thu 10a-2p ?Services: Extractions only! ?Payment Options: ?FREE (donations accepted) - bring proof of income or support ?Best way to get seen: ?Call and schedule an appointment OR come at 8am on the 1st Monday of every month (except for holidays) when it is first come/first served. ?  ?  ?[facility] ?[phone] ?  ?Location: ?[address] [place] ?Clinic Hours: ?Friday mornings ?Services, Payment Options, Best way to get seen: ?Call for info  ?

## 2022-05-01 ENCOUNTER — Encounter: Payer: Self-pay | Admitting: Emergency Medicine

## 2022-05-01 ENCOUNTER — Emergency Department
Admission: EM | Admit: 2022-05-01 | Discharge: 2022-05-01 | Disposition: A | Discharge status: Home / Self Care | Payer: Medicaid Other | Attending: Emergency Medicine | Admitting: Emergency Medicine

## 2022-05-01 DIAGNOSIS — R590 Localized enlarged lymph nodes: Secondary | ICD-10-CM | POA: Insufficient documentation

## 2022-05-01 DIAGNOSIS — Z20822 Contact with and (suspected) exposure to covid-19: Secondary | ICD-10-CM | POA: Insufficient documentation

## 2022-05-01 DIAGNOSIS — R509 Fever, unspecified: Secondary | ICD-10-CM | POA: Insufficient documentation

## 2022-05-01 DIAGNOSIS — J029 Acute pharyngitis, unspecified: Secondary | ICD-10-CM

## 2022-05-01 LAB — SARS CORONAVIRUS 2 BY RT PCR: SARS Coronavirus 2 by RT PCR: NEGATIVE

## 2022-05-01 LAB — GROUP A STREP BY PCR: Group A Strep by PCR: NOT DETECTED

## 2022-05-01 MED ORDER — DEXAMETHASONE 10 MG/ML FOR PEDIATRIC ORAL USE
10.0000 mg | Freq: Once | INTRAMUSCULAR | Status: AC
  Administered 2022-05-01: 10 mg via ORAL
  Filled 2022-05-01: qty 1

## 2022-05-01 MED ORDER — DEXAMETHASONE 10 MG/ML FOR PEDIATRIC ORAL USE: 16.0000 mg | Freq: Once | INTRAMUSCULAR | Status: DC

## 2022-05-01 MED ORDER — DEXAMETHASONE 6 MG PO TABS
10.0000 mg | ORAL_TABLET | Freq: Once | ORAL | Status: DC
  Filled 2022-05-01: qty 1

## 2022-05-01 MED ORDER — IBUPROFEN 800 MG PO TABS
800.0000 mg | ORAL_TABLET | Freq: Once | ORAL | Status: AC
Start: 2022-05-01 — End: 2022-05-01
  Administered 2022-05-01: 800 mg via ORAL
  Filled 2022-05-01: qty 1

## 2022-05-01 NOTE — ED Triage Notes (Signed)
Pt presents via POV with complaints of sore throat and intermittent fevers (unsure how high because he "hasn't checked"). Pt notes taking Ibuprofen 4 hours ago. Denies N/V, SOB, or CP.

## 2022-05-01 NOTE — Discharge Instructions (Signed)
You may alternate Tylenol 1000 mg every 6 hours as needed for pain, fever and Ibuprofen 800 mg every 6-8 hours as needed for pain, fever.  Please take Ibuprofen with food.  Do not take more than 4000 mg of Tylenol (acetaminophen) in a 24 hour period.  You may use over-the-counter Chloraseptic spray, throat lozenges to help with your sore throat.  You may use warm salt water gargle several times a day.  Your strep test and COVID test were negative.  You do not need antibiotics at this time.   Steps to find a Primary Care Provider (PCP):  Call [phone] or [phone] to access "[place] Find a Doctor Service."  2.  You may also go on the [facility] website at [email or web address]
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
## 2022-09-01 ENCOUNTER — Emergency Department: Payer: Medicaid Other

## 2022-09-01 ENCOUNTER — Other Ambulatory Visit: Payer: Self-pay

## 2022-09-01 DIAGNOSIS — N453 Epididymo-orchitis: Secondary | ICD-10-CM | POA: Insufficient documentation

## 2022-09-01 LAB — CBC WITH DIFFERENTIAL/PLATELET
Abs Immature Granulocytes: 0.04 10*3/uL (ref 0.00–0.07)
Basophils Absolute: 0.1 10*3/uL (ref 0.0–0.1)
Basophils Relative: 1 %
Eosinophils Absolute: 0.2 10*3/uL (ref 0.0–0.5)
Eosinophils Relative: 1 %
HCT: 39.4 % (ref 39.0–52.0)
Hemoglobin: 12.8 g/dL — ABNORMAL LOW (ref 13.0–17.0)
Immature Granulocytes: 0 %
Lymphocytes Relative: 14 %
Lymphs Abs: 1.9 10*3/uL (ref 0.7–4.0)
MCH: 31.8 pg (ref 26.0–34.0)
MCHC: 32.5 g/dL (ref 30.0–36.0)
MCV: 97.8 fL (ref 80.0–100.0)
Monocytes Absolute: 1.5 10*3/uL — ABNORMAL HIGH (ref 0.1–1.0)
Monocytes Relative: 12 %
Neutro Abs: 9.7 10*3/uL — ABNORMAL HIGH (ref 1.7–7.7)
Neutrophils Relative %: 72 %
Platelets: 315 10*3/uL (ref 150–400)
RBC: 4.03 MIL/uL — ABNORMAL LOW (ref 4.22–5.81)
RDW: 11.8 % (ref 11.5–15.5)
WBC: 13.4 10*3/uL — ABNORMAL HIGH (ref 4.0–10.5)
nRBC: 0 % (ref 0.0–0.2)

## 2022-09-01 LAB — BASIC METABOLIC PANEL
Anion gap: 8 (ref 5–15)
BUN: 10 mg/dL (ref 6–20)
CO2: 26 mmol/L (ref 22–32)
Calcium: 8.9 mg/dL (ref 8.9–10.3)
Chloride: 104 mmol/L (ref 98–111)
Creatinine, Ser: 1.08 mg/dL (ref 0.61–1.24)
GFR, Estimated: >60 mL/min (ref >60)
Glucose, Bld: 124 mg/dL — ABNORMAL HIGH (ref 70–99)
Potassium: 3.7 mmol/L (ref 3.5–5.1)
Sodium: 138 mmol/L (ref 135–145)

## 2022-09-01 LAB — URINALYSIS, ROUTINE W REFLEX MICROSCOPIC
Bacteria, UA: NONE SEEN
Bilirubin Urine: NEGATIVE
Glucose, UA: NEGATIVE mg/dL
Hgb urine dipstick: NEGATIVE
Ketones, ur: NEGATIVE mg/dL
Leukocytes,Ua: NEGATIVE
Nitrite: NEGATIVE
Protein, ur: 30 mg/dL — AB
Specific Gravity, Urine: 1.027 (ref 1.005–1.030)
pH: 6 (ref 5.0–8.0)

## 2022-09-01 MED ORDER — ACETAMINOPHEN 500 MG PO TABS
1000.0000 mg | ORAL_TABLET | Freq: Once | ORAL | Status: AC
  Administered 2022-09-01: 1000 mg via ORAL
  Filled 2022-09-01: qty 2

## 2022-09-01 NOTE — ED Triage Notes (Signed)
Pt arrives with c/o right side testicle pain that started today. Per pt, pt radiates up into his lower ABD pain.

## 2022-09-01 NOTE — ED Provider Triage Note (Signed)
  Emergency Medicine Provider Triage Evaluation Note  Gabriel Baker , a 26 y.o.male,  was evaluated in triage.  Pt complains of right-sided testicular pain.  Patient states that it started today, reports radiation into the lower pelvic region.  Denies any recent heavy lifting.  Denies any injuries.   Review of Systems  Positive: Right-sided testicular pain Negative: Denies fever, chest pain, vomiting  Physical Exam   Vitals:   09/01/22 2204  BP: (!) 154/81  Pulse: 90  Resp: 16  Temp: 100.3 F (37.9 C)  SpO2: 98%   Gen:   Awake, no distress   Resp:  Normal effort  MSK:   Moves extremities without difficulty  Other:    Medical Decision Making  Given the patient's initial medical screening exam, the following diagnostic evaluation has been ordered. The patient will be placed in the appropriate treatment space, once one is available, to complete the evaluation and treatment. I have discussed the plan of care with the patient and I have advised the patient that an ED physician or mid-level practitioner will reevaluate their condition after the test results have been received, as the results may give them additional insight into the type of treatment they may need.    Diagnostics: Labs, UA, ultrasound  Treatments: none immediately   Teodoro Spray, [place] 09/01/22 2206

## 2022-09-02 ENCOUNTER — Emergency Department
Admission: EM | Admit: 2022-09-02 | Discharge: 2022-09-02 | Disposition: A | Discharge status: Home / Self Care | Payer: Medicaid Other | Attending: Emergency Medicine | Admitting: Emergency Medicine

## 2022-09-02 DIAGNOSIS — N453 Epididymo-orchitis: Secondary | ICD-10-CM

## 2022-09-02 LAB — CHLAMYDIA/NGC RT PCR (ARMC ONLY)
Chlamydia Tr: NOT DETECTED
N gonorrhoeae: NOT DETECTED

## 2022-09-02 MED ORDER — HYDROCODONE-ACETAMINOPHEN 5-325 MG PO TABS: 1.0000 | ORAL_TABLET | Freq: Four times a day (QID) | ORAL | 0 refills | Status: DC | PRN

## 2022-09-02 MED ORDER — LEVOFLOXACIN 500 MG PO TABS
500.0000 mg | ORAL_TABLET | Freq: Once | ORAL | Status: AC
  Administered 2022-09-02: 500 mg via ORAL
  Filled 2022-09-02: qty 1

## 2022-09-02 MED ORDER — LEVOFLOXACIN 500 MG PO TABS: 500.0000 mg | ORAL_TABLET | Freq: Every day | ORAL | 0 refills | Status: AC

## 2022-09-02 MED ORDER — HYDROCODONE-ACETAMINOPHEN 5-325 MG PO TABS
1.0000 | ORAL_TABLET | Freq: Once | ORAL | Status: AC
  Administered 2022-09-02: 1 via ORAL
  Filled 2022-09-02: qty 1

## 2022-09-02 MED ORDER — IBUPROFEN 800 MG PO TABS
800.0000 mg | ORAL_TABLET | Freq: Three times a day (TID) | ORAL | 0 refills | Status: DC | PRN
Start: 2022-09-02 — End: 2022-09-19

## 2022-09-02 NOTE — ED Notes (Addendum)
Pt discharged prior to RN assessment. Pt verbalized understanding of discharge instructions, prescriptions, and follow-up care instructions. Pt advised if symptoms worsen to return to ED. 
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
## 2022-09-02 NOTE — Discharge Instructions (Addendum)
You are being provided a prescription for opiates (also known as narcotics) for pain control.  Opiates can be addictive and should only be used when absolutely necessary for pain control when other alternatives do not work.  We recommend you only use them for the recommended amount of time and only as prescribed.  Please do not take with other sedative medications or alcohol.  Please do not drive, operate machinery, make important decisions while taking opiates.  Please note that these medications can be addictive and have high abuse potential.  Patients can become addicted to narcotics after only taking them for a few days.  Please keep these medications locked away from children, teenagers or any family members with history of substance abuse.  Narcotic pain medicine may also make you constipated.  You may use over-the-counter medications such as MiraLAX, Colace to prevent constipation.  If you become constipated, you may use over-the-counter enemas as needed.  Itching and nausea are also common side effects of narcotic pain medication.  If you develop uncontrolled vomiting or a rash, please stop these medications and seek medical care.   Steps to find a Primary Care Provider (PCP):  Call [phone] or [phone] to access "[place] Find a Doctor Service."  2.  You may also go on the [place] website at [email or web address]   

## 2022-09-19 ENCOUNTER — Encounter: Payer: Self-pay | Admitting: Emergency Medicine

## 2022-09-19 ENCOUNTER — Emergency Department
Admission: EM | Admit: 2022-09-19 | Discharge: 2022-09-19 | Disposition: A | Discharge status: Home / Self Care | Payer: Self-pay | Attending: Emergency Medicine | Admitting: Emergency Medicine

## 2022-09-19 ENCOUNTER — Other Ambulatory Visit: Payer: Self-pay

## 2022-09-19 DIAGNOSIS — K0889 Other specified disorders of teeth and supporting structures: Secondary | ICD-10-CM

## 2022-09-19 DIAGNOSIS — K029 Dental caries, unspecified: Secondary | ICD-10-CM | POA: Insufficient documentation

## 2022-09-19 DIAGNOSIS — K0381 Cracked tooth: Secondary | ICD-10-CM | POA: Insufficient documentation

## 2022-09-19 MED ORDER — AMOXICILLIN 500 MG PO CAPS
500.0000 mg | ORAL_CAPSULE | Freq: Once | ORAL | Status: AC
  Administered 2022-09-19: 500 mg via ORAL
  Filled 2022-09-19: qty 1

## 2022-09-19 MED ORDER — LIDOCAINE VISCOUS HCL 2 % MT SOLN
15.0000 mL | Freq: Once | OROMUCOSAL | Status: AC
  Administered 2022-09-19: 15 mL via OROMUCOSAL
  Filled 2022-09-19: qty 15

## 2022-09-19 MED ORDER — IBUPROFEN 600 MG PO TABS
600.0000 mg | ORAL_TABLET | Freq: Once | ORAL | Status: AC
  Administered 2022-09-19: 600 mg via ORAL
  Filled 2022-09-19: qty 1

## 2022-09-19 MED ORDER — IBUPROFEN 600 MG PO TABS: 600.0000 mg | ORAL_TABLET | Freq: Three times a day (TID) | ORAL | 0 refills | Status: AC | PRN

## 2022-09-19 MED ORDER — AMOXICILLIN 500 MG PO CAPS: 500.0000 mg | ORAL_CAPSULE | Freq: Three times a day (TID) | ORAL | 0 refills | Status: DC

## 2022-09-19 MED ORDER — HYDROCODONE-ACETAMINOPHEN 5-325 MG PO TABS: 1.0000 | ORAL_TABLET | Freq: Four times a day (QID) | ORAL | 0 refills | Status: DC | PRN

## 2022-09-19 NOTE — ED Triage Notes (Signed)
Patient ambulatory to triage with steady gait, without difficulty or distress noted; pt reports rt lower dental pain

## 2022-09-19 NOTE — ED Notes (Signed)
E-signature pad unavailable - Pt verbalized understanding of D/C information - no additional concerns at this time.  
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
## 2022-09-19 NOTE — Discharge Instructions (Signed)
1. Take antibiotic as prescribed (Amoxicillin 500mg  three times daily x 7 days). 2. Take pain medicines as needed (Motrin/Norco #15). 3. Return to the ER for worsening symptoms, persistent vomiting, fever, difficulty breathing or other concerns.

## 2022-11-24 ENCOUNTER — Emergency Department: Payer: PRIVATE HEALTH INSURANCE

## 2022-11-24 ENCOUNTER — Encounter: Payer: Self-pay | Admitting: Emergency Medicine

## 2022-11-24 DIAGNOSIS — M79644 Pain in right finger(s): Secondary | ICD-10-CM | POA: Diagnosis present

## 2022-11-24 DIAGNOSIS — Y9371 Activity, boxing: Secondary | ICD-10-CM | POA: Insufficient documentation

## 2022-11-24 DIAGNOSIS — X58XXXA Exposure to other specified factors, initial encounter: Secondary | ICD-10-CM | POA: Diagnosis not present

## 2022-11-24 DIAGNOSIS — S62234A Other nondisplaced fracture of base of first metacarpal bone, right hand, initial encounter for closed fracture: Secondary | ICD-10-CM | POA: Diagnosis not present

## 2022-11-24 DIAGNOSIS — U071 COVID-19: Secondary | ICD-10-CM | POA: Insufficient documentation

## 2022-11-24 LAB — RESP PANEL BY RT-PCR (RSV, FLU A&B, COVID)  RVPGX2
Influenza A by PCR: NEGATIVE
Influenza B by PCR: NEGATIVE
Resp Syncytial Virus by PCR: NEGATIVE
SARS Coronavirus 2 by RT PCR: POSITIVE — AB
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
## 2022-11-25 ENCOUNTER — Emergency Department
Admission: EM | Admit: 2022-11-25 | Discharge: 2022-11-25 | Disposition: A | Discharge status: Home / Self Care | Payer: PRIVATE HEALTH INSURANCE | Attending: Emergency Medicine | Admitting: Emergency Medicine

## 2022-11-25 DIAGNOSIS — U071 COVID-19: Secondary | ICD-10-CM

## 2022-11-25 DIAGNOSIS — S62234A Other nondisplaced fracture of base of first metacarpal bone, right hand, initial encounter for closed fracture: Secondary | ICD-10-CM

## 2022-11-25 NOTE — ED Notes (Signed)
Pt Dc to home. Dc instructions reviewed with all questions answered. Pt voices understanding. Pt ambulatory out of dept with steady gait 

## 2022-11-25 NOTE — ED Provider Notes (Signed)
[facility] Provider Note    Event Date/Time   First MD Initiated Contact with Patient 11/25/22 0139     (approximate)   History   Chief Complaint: Finger Injury   HPI  Gabriel Baker is a 27 y.o. male with no significant past medical history comes ED complaining of right thumb pain for the past 2 days after boxing practice.  A moldable splint was applied by his team sports medicine staff.  No other injuries.  Also complains of body aches and fatigue for the past 2 days.     Physical Exam   Triage Vital Signs: ED Triage Vitals  Enc Vitals Group     BP 11/24/22 2022 120/71     Pulse Rate 11/24/22 2022 81     Resp 11/24/22 2022 16     Temp 11/24/22 2022 99.2 F (37.3 C)     Temp Source 11/24/22 2022 Oral     SpO2 11/24/22 2022 97 %     Weight 11/24/22 2025 141 lb 8.6 oz (64.2 kg)     Height 11/24/22 2025 5\' 6"  (1.676 m)     Head Circumference --      Peak Flow --      Pain Score 11/24/22 2024 10     Pain Loc --      Pain Edu? --      Excl. in [place]? --     Most recent vital signs: Vitals:   11/24/22 2022  BP: 120/71  Pulse: 81  Resp: 16  Temp: 99.2 F (37.3 C)  SpO2: 97%    General: Awake, no distress.  CV:  Good peripheral perfusion.  Resp:  Normal effort.  Abd:  No distention.  Other:  Swelling and tenderness over the right first metacarpal.  No wounds.  Intact range of motion.   ED Results / Procedures / Treatments   Labs (all labs ordered are listed, but only abnormal results are displayed) Labs Reviewed  RESP PANEL BY RT-PCR (RSV, FLU A&B, COVID)  RVPGX2 - Abnormal; Notable for the following components:      Result Value   SARS Coronavirus 2 by RT PCR POSITIVE (*)    All other components within normal limits     EKG    RADIOLOGY X-ray right hand interpreted by me, shows fracture at the base of the right first metacarpal, nondisplaced.  Radiology report reviewed   PROCEDURES:  .Ortho Injury  Treatment  Date/Time: 11/25/2022 2:12 AM  Performed by: 01/24/2023, MD Authorized by: Sharman Cheek, MD   Consent:    Consent obtained:  Verbal   Consent given by:  Patient   Risks discussed:  Stiffness and restricted joint movement   Alternatives discussed:  No treatmentInjury location: hand Location details: right hand Injury type: fracture Fracture type: first metacarpal Pre-procedure neurovascular assessment: neurovascularly intact Pre-procedure distal perfusion: normal Pre-procedure neurological function: normal Pre-procedure range of motion: normal  Anesthesia: Local anesthesia used: no  Patient sedated: NoManipulation performed: no Immobilization: splint Splint type: thumb spica Splint Applied by: ED Tech Supplies used: cotton padding, elastic bandage and Ortho-Glass Post-procedure neurovascular assessment: post-procedure neurovascularly intact Post-procedure distal perfusion: normal Post-procedure neurological function: normal Post-procedure range of motion: normal      MEDICATIONS ORDERED IN ED: Medications - No data to display   IMPRESSION / MDM / ASSESSMENT AND PLAN / ED COURSE  I reviewed the triage vital signs and the nursing notes.  Differential diagnosis includes, but is not limited to, hand fracture, thumb dislocation, viral illness.  Doubt septic arthritis  Patient's presentation is most consistent with acute illness / injury with system symptoms.  Patient presents with right thumb pain, found to have a first metacarpal fracture.  Will place in a thumb spica splint.  Constitutional symptoms due to suspected viral illness.  COVID-positive.  NSAIDs for pain and fever.  Patient denies any trouble with appetite or nausea.       FINAL CLINICAL IMPRESSION(S) / ED DIAGNOSES   Final diagnoses:  Other closed nondisplaced fracture of base of first metacarpal bone of right hand, initial encounter  COVID-19 virus  infection     Rx / DC Orders   ED Discharge Orders     None        Note:  This document was prepared using Dragon voice recognition software and may include unintentional dictation errors.   Carrie Mew, MD 11/25/22 [phone]

## 2023-01-19 ENCOUNTER — Emergency Department
Admission: EM | Admit: 2023-01-19 | Discharge: 2023-01-20 | Disposition: A | Discharge status: Home / Self Care | Payer: Medicaid Other | Attending: Emergency Medicine | Admitting: Emergency Medicine

## 2023-01-19 ENCOUNTER — Emergency Department: Payer: Medicaid Other

## 2023-01-19 ENCOUNTER — Encounter: Payer: Self-pay | Admitting: *Deleted

## 2023-01-19 ENCOUNTER — Other Ambulatory Visit: Payer: Self-pay

## 2023-01-19 DIAGNOSIS — X58XXXA Exposure to other specified factors, initial encounter: Secondary | ICD-10-CM | POA: Insufficient documentation

## 2023-01-19 DIAGNOSIS — M79642 Pain in left hand: Secondary | ICD-10-CM

## 2023-01-19 DIAGNOSIS — S63502A Unspecified sprain of left wrist, initial encounter: Secondary | ICD-10-CM | POA: Insufficient documentation

## 2023-01-19 DIAGNOSIS — M25511 Pain in right shoulder: Secondary | ICD-10-CM

## 2023-01-19 DIAGNOSIS — S6392XA Sprain of unspecified part of left wrist and hand, initial encounter: Secondary | ICD-10-CM | POA: Diagnosis not present

## 2023-01-19 DIAGNOSIS — M25532 Pain in left wrist: Secondary | ICD-10-CM | POA: Diagnosis present

## 2023-01-19 MED ORDER — KETOROLAC TROMETHAMINE 60 MG/2ML IM SOLN
30.0000 mg | Freq: Once | INTRAMUSCULAR | Status: AC
  Administered 2023-01-20: 30 mg via INTRAMUSCULAR
  Filled 2023-01-19: qty 2

## 2023-01-19 MED ORDER — HYDROCODONE-ACETAMINOPHEN 5-325 MG PO TABS
1.0000 | ORAL_TABLET | Freq: Once | ORAL | Status: AC
  Administered 2023-01-20: 1 via ORAL
  Filled 2023-01-19: qty 1
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
## 2023-01-19 NOTE — ED Triage Notes (Addendum)
Pt has left wrist pain.  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  Pt reports pain for 2 weeks. Pt also has right shoulder pain.  Good rom.    Pt alert.

## 2023-01-20 ENCOUNTER — Emergency Department: Payer: Medicaid Other

## 2023-01-20 MED ORDER — HYDROCODONE-ACETAMINOPHEN 5-325 MG PO TABS: 1.0000 | ORAL_TABLET | Freq: Four times a day (QID) | ORAL | 0 refills | Status: AC | PRN

## 2023-01-20 MED ORDER — NAPROXEN 500 MG PO TABS: 500.0000 mg | ORAL_TABLET | Freq: Two times a day (BID) | ORAL | 0 refills | Status: AC

## 2023-01-20 NOTE — Discharge Instructions (Signed)
You may take pain medicines as needed (Naprosyn/Norco).  Wear splint and sling as needed for comfort.  Return to the ER for worsening symptoms, persistent vomiting, difficulty breathing or other concerns.

## 2023-01-24 ENCOUNTER — Emergency Department: Payer: Medicaid Other

## 2023-01-24 ENCOUNTER — Other Ambulatory Visit: Payer: Self-pay

## 2023-01-24 ENCOUNTER — Emergency Department
Admission: EM | Admit: 2023-01-24 | Discharge: 2023-01-24 | Disposition: A | Discharge status: Home / Self Care | Payer: Medicaid Other | Attending: Emergency Medicine | Admitting: Emergency Medicine

## 2023-01-24 DIAGNOSIS — R911 Solitary pulmonary nodule: Secondary | ICD-10-CM | POA: Insufficient documentation

## 2023-01-24 DIAGNOSIS — R630 Anorexia: Secondary | ICD-10-CM | POA: Diagnosis not present

## 2023-01-24 DIAGNOSIS — R1032 Left lower quadrant pain: Secondary | ICD-10-CM | POA: Insufficient documentation

## 2023-01-24 DIAGNOSIS — R809 Proteinuria, unspecified: Secondary | ICD-10-CM

## 2023-01-24 DIAGNOSIS — R1031 Right lower quadrant pain: Secondary | ICD-10-CM | POA: Insufficient documentation

## 2023-01-24 DIAGNOSIS — R109 Unspecified abdominal pain: Secondary | ICD-10-CM

## 2023-01-24 LAB — CBC WITH DIFFERENTIAL/PLATELET
Abs Immature Granulocytes: 0.03 10*3/uL (ref 0.00–0.07)
Basophils Absolute: 0 10*3/uL (ref 0.0–0.1)
Basophils Relative: 1 %
Eosinophils Absolute: 0.1 10*3/uL (ref 0.0–0.5)
Eosinophils Relative: 2 %
HCT: 39.9 % (ref 39.0–52.0)
Hemoglobin: 12.9 g/dL — ABNORMAL LOW (ref 13.0–17.0)
Immature Granulocytes: 0 %
Lymphocytes Relative: 21 %
Lymphs Abs: 1.9 10*3/uL (ref 0.7–4.0)
MCH: 29.7 pg (ref 26.0–34.0)
MCHC: 32.3 g/dL (ref 30.0–36.0)
MCV: 91.9 fL (ref 80.0–100.0)
Monocytes Absolute: 0.9 10*3/uL (ref 0.1–1.0)
Monocytes Relative: 11 %
Neutro Abs: 5.8 10*3/uL (ref 1.7–7.7)
Neutrophils Relative %: 65 %
Platelets: 368 10*3/uL (ref 150–400)
RBC: 4.34 MIL/uL (ref 4.22–5.81)
RDW: 12.4 % (ref 11.5–15.5)
WBC: 8.8 10*3/uL (ref 4.0–10.5)
nRBC: 0 % (ref 0.0–0.2)

## 2023-01-24 LAB — URINALYSIS, ROUTINE W REFLEX MICROSCOPIC
Bilirubin Urine: NEGATIVE
Glucose, UA: NEGATIVE mg/dL
Ketones, ur: NEGATIVE mg/dL
Leukocytes,Ua: NEGATIVE
Nitrite: NEGATIVE
Protein, ur: >=300 mg/dL — AB
Specific Gravity, Urine: 1.041 — ABNORMAL HIGH (ref 1.005–1.030)
pH: 5 (ref 5.0–8.0)

## 2023-01-24 LAB — COMPREHENSIVE METABOLIC PANEL
ALT: 15 U/L (ref 0–44)
AST: 29 U/L (ref 15–41)
Albumin: <1.5 g/dL — ABNORMAL LOW (ref 3.5–5.0)
Alkaline Phosphatase: 113 U/L (ref 38–126)
Anion gap: 6 (ref 5–15)
BUN: 18 mg/dL (ref 6–20)
CO2: 23 mmol/L (ref 22–32)
Calcium: 7.3 mg/dL — ABNORMAL LOW (ref 8.9–10.3)
Chloride: 104 mmol/L (ref 98–111)
Creatinine, Ser: 1.24 mg/dL (ref 0.61–1.24)
GFR, Estimated: >60 mL/min (ref >60)
Glucose, Bld: 103 mg/dL — ABNORMAL HIGH (ref 70–99)
Potassium: 3.9 mmol/L (ref 3.5–5.1)
Sodium: 133 mmol/L — ABNORMAL LOW (ref 135–145)
Total Bilirubin: 0.7 mg/dL (ref 0.3–1.2)
Total Protein: 6 g/dL — ABNORMAL LOW (ref 6.5–8.1)

## 2023-01-24 LAB — CK: Total CK: 194 U/L (ref 49–397)

## 2023-01-24 LAB — LIPASE, BLOOD: Lipase: 28 U/L (ref 11–51)

## 2023-01-24 MED ORDER — ONDANSETRON 4 MG PO TBDP: 4.0000 mg | ORAL_TABLET | Freq: Three times a day (TID) | ORAL | 0 refills | Status: AC | PRN

## 2023-01-24 MED ORDER — ONDANSETRON HCL 4 MG/2ML IJ SOLN
4.0000 mg | Freq: Once | INTRAMUSCULAR | Status: AC
  Administered 2023-01-24: 4 mg via INTRAVENOUS
  Filled 2023-01-24: qty 2

## 2023-01-24 MED ORDER — IOHEXOL 300 MG/ML  SOLN
100.0000 mL | Freq: Once | INTRAMUSCULAR | Status: AC | PRN
  Administered 2023-01-24: 100 mL via INTRAVENOUS

## 2023-01-24 MED ORDER — DICYCLOMINE HCL 10 MG PO CAPS: 10.0000 mg | ORAL_CAPSULE | Freq: Three times a day (TID) | ORAL | 0 refills | Status: AC

## 2023-01-24 MED ORDER — SODIUM CHLORIDE 0.9 % IV BOLUS
1000.0000 mL | Freq: Once | INTRAVENOUS | Status: AC
  Administered 2023-01-24: 1000 mL via INTRAVENOUS

## 2023-01-24 MED ORDER — KETOROLAC TROMETHAMINE 15 MG/ML IJ SOLN
15.0000 mg | Freq: Once | INTRAMUSCULAR | Status: AC
  Administered 2023-01-24: 15 mg via INTRAVENOUS
  Filled 2023-01-24: qty 1

## 2023-01-24 NOTE — ED Triage Notes (Signed)
Patient reports lower abdominal pain and that he hasn't had a normal bowel movement in 3 days; Also reports sharp epigastric pain when he's eating

## 2023-01-24 NOTE — ED Provider Notes (Addendum)
[facility] Provider Note    Event Date/Time   First MD Initiated Contact with Patient 01/24/23 1916     (approximate)   History   Abdominal Pain (Patient reports lower abdominal pain and that he hasn't had a normal bowel movement in 3 days; Also reports sharp epigastric pain when he's eating)   HPI  Gabriel Baker is a 26 y.o. male with no significant past medical history who presents to the emergency department with abdominal pain.  Endorses 2 days of abdominal pain.  Lower abdominal pain to the bilateral lower abdomen.  Sharp stabbing pain that has been intermittent.  1 episode of vomiting.  2 episodes of diarrhea that has been nonbloody.  Normal urine output.  Tolerating liquids but decreased p.o. intake.  Denies any alcohol, marijuana or nicotine use.  Denies any vaping.  No prior abdominal surgeries.     Physical Exam   Triage Vital Signs: ED Triage Vitals  Enc Vitals Group     BP 01/24/23 1906 136/77     Pulse Rate 01/24/23 1906 60     Resp 01/24/23 1906 19     Temp 01/24/23 1906 97.8 F (36.6 C)     Temp Source 01/24/23 1906 Oral     SpO2 01/24/23 1906 99 %     Weight 01/24/23 1907 158 lb (71.7 kg)     Height 01/24/23 1907 '5\' 6"'$  (1.676 m)     Head Circumference --      Peak Flow --      Pain Score 01/24/23 1906 10     Pain Loc --      Pain Edu? --      Excl. in [place]? --     Most recent vital signs: Vitals:   01/24/23 2100 01/24/23 2130  BP: (!) 100/57 122/76  Pulse: (!) 57 (!) 59  Resp: 14   Temp:    SpO2: 99% 99%    Physical Exam Constitutional:      Appearance: He is well-developed.  HENT:     Head: Atraumatic.  Eyes:     Conjunctiva/sclera: Conjunctivae normal.  Cardiovascular:     Rate and Rhythm: Regular rhythm.  Pulmonary:     Effort: No respiratory distress.  Abdominal:     Tenderness: There is abdominal tenderness in the right lower quadrant and left lower quadrant.  Musculoskeletal:     Cervical back: Normal  range of motion.  Skin:    General: Skin is warm.     Capillary Refill: Capillary refill takes less than 2 seconds.  Neurological:     Mental Status: He is alert. Mental status is at baseline.      IMPRESSION / MDM / ASSESSMENT AND PLAN / ED COURSE  I reviewed the triage vital signs and the nursing notes.  Differential diagnosis including appendicitis, viral illness including gastroenteritis, viral illness including COVID/influenza, urinary tract infection, kidney stone   RADIOLOGY I independently reviewed imaging, my interpretation of imaging: CT abdomen and pelvis -independently reviewed with no signs of acute appendicitis.  Read as signs of colitis.  Incidental finding of a lung nodule.  No signs of kidney injury.   Labs (all labs ordered are listed, but only abnormal results are displayed) Labs interpreted as -  Patient with a large amount of protein in his urine.  Creatinine 1.24 which is close to his baseline of 1.08.  Does have a low albumin level and low protein level on lab work.  Corrected calcium  within normal limits.  Mild hyponatremia which is likely secondary to dehydration.  UA without signs of urinary tract infection.  Showed large amount of protein  Labs Reviewed  COMPREHENSIVE METABOLIC PANEL - Abnormal; Notable for the following components:      Result Value   Sodium 133 (*)    Glucose, Bld 103 (*)    Calcium 7.3 (*)    Total Protein 6.0 (*)    Albumin <1.5 (*)    All other components within normal limits  URINALYSIS, ROUTINE W REFLEX MICROSCOPIC - Abnormal; Notable for the following components:   Color, Urine AMBER (*)    APPearance HAZY (*)    Specific Gravity, Urine 1.041 (*)    Hgb urine dipstick SMALL (*)    Protein, ur >=300 (*)    Bacteria, UA RARE (*)    All other components within normal limits  CBC WITH DIFFERENTIAL/PLATELET - Abnormal; Notable for the following components:   Hemoglobin 12.9 (*)    All other components within normal limits   LIPASE, BLOOD  CK   Discussed with nephrology Dr. Joylene John, recommended outpatient follow-up for proteinuria.  Patient was given information to follow-up with a primary care physician.  Discussed oral hydration.  Discussed incidental findings found on CT scan and need for repeat CT scans.  Given return precautions for any worsening symptoms.      PROCEDURES:  Critical Care performed: No  Procedures  Patient's presentation is most consistent with acute presentation with potential threat to life or bodily function.   MEDICATIONS ORDERED IN ED: Medications  sodium chloride 0.9 % bolus 1,000 mL (1,000 mLs Intravenous New Bag/Given 01/24/23 2019)  ondansetron (ZOFRAN) injection 4 mg (4 mg Intravenous Given 01/24/23 2021)  ketorolac (TORADOL) 15 MG/ML injection 15 mg (15 mg Intravenous Given 01/24/23 2021)  iohexol (OMNIPAQUE) 300 MG/ML solution 100 mL (100 mLs Intravenous Contrast Given 01/24/23 2035)    FINAL CLINICAL IMPRESSION(S) / ED DIAGNOSES   Final diagnoses:  Abdominal pain, unspecified abdominal location  Proteinuria, unspecified type  Pulmonary nodule     Rx / DC Orders   ED Discharge Orders          Ordered    ondansetron (ZOFRAN-ODT) 4 MG disintegrating tablet  Every 8 hours PRN        01/24/23 2151    dicyclomine (BENTYL) 10 MG capsule  3 times daily before meals & bedtime        01/24/23 2200             Note:  This document was prepared using Dragon voice recognition software and may include unintentional dictation errors.   Nathaniel Man, MD 01/24/23 2153    Nathaniel Man, MD 01/24/23 2200

## 2023-01-24 NOTE — Discharge Instructions (Addendum)
You were seen in the emergency department for abdominal pain.  You had a CT scan that did not show any signs of an appendicitis.  Your lab work showed signs of protein loss in your urine.  You are given the number for nephrology (kidney specialist), it is important that you call them to schedule close follow-up appointment so they can recheck the protein and make sure that you do not have a kidney issue.  You are given information to follow-up with her primary care physician.  Call first thing Monday to schedule an appointment.  You had an incidental finding of a lung nodule on your CT scan, you need a repeat CT scan done in 3 to 6 months to make sure that this is not changing.  If unchanged can then have a repeat scan in 1 year to 2 years.  Talk to your primary care physician to help schedule this test.  You are given a prescription for nausea medication.  Stay hydrated and drink plenty of fluids.  You can take Tylenol for pain control.  Return to the emergency department for any worsening symptoms.

## 2023-01-24 NOTE — ED Notes (Signed)
Warm blanket provided to patient.

## 2023-02-04 ENCOUNTER — Telehealth: Payer: Self-pay

## 2023-02-04 NOTE — Telephone Encounter (Signed)
Mychart msg sent

## 2023-03-02 ENCOUNTER — Other Ambulatory Visit: Payer: Self-pay

## 2023-03-02 ENCOUNTER — Encounter: Payer: Self-pay | Admitting: *Deleted

## 2023-03-02 ENCOUNTER — Emergency Department
Admission: EM | Admit: 2023-03-02 | Discharge: 2023-03-02 | Disposition: A | Discharge status: Home / Self Care | Payer: Medicaid Other | Attending: Emergency Medicine | Admitting: Emergency Medicine

## 2023-03-02 DIAGNOSIS — R509 Fever, unspecified: Secondary | ICD-10-CM | POA: Diagnosis present

## 2023-03-02 DIAGNOSIS — J020 Streptococcal pharyngitis: Secondary | ICD-10-CM | POA: Insufficient documentation

## 2023-03-02 DIAGNOSIS — Z1152 Encounter for screening for COVID-19: Secondary | ICD-10-CM | POA: Insufficient documentation

## 2023-03-02 LAB — GROUP A STREP BY PCR: Group A Strep by PCR: DETECTED — AB

## 2023-03-02 LAB — RESP PANEL BY RT-PCR (RSV, FLU A&B, COVID)  RVPGX2
Influenza A by PCR: NEGATIVE
Influenza B by PCR: NEGATIVE
Resp Syncytial Virus by PCR: NEGATIVE
SARS Coronavirus 2 by RT PCR: NEGATIVE

## 2023-03-02 MED ORDER — AMOXICILLIN 875 MG PO TABS: 875.0000 mg | ORAL_TABLET | Freq: Two times a day (BID) | ORAL | 0 refills | Status: DC

## 2023-03-02 MED ORDER — ACETAMINOPHEN 500 MG PO TABS
1000.0000 mg | ORAL_TABLET | Freq: Once | ORAL | Status: AC
  Administered 2023-03-02: 1000 mg via ORAL
  Filled 2023-03-02: qty 2

## 2023-03-02 MED ORDER — AMOXICILLIN 875 MG PO TABS: 875.0000 mg | ORAL_TABLET | Freq: Two times a day (BID) | ORAL | 0 refills | Status: AC

## 2023-03-02 NOTE — ED Provider Notes (Signed)
[facility] Provider Note  Patient Contact: 5:27 PM (approximate)   History   Fever   HPI  Gabriel Baker is a 27 y.o. male who presents the emergency department complaining of sore throat, body aches, fever.  Symptoms have been ongoing x 2 to 3 days.  Denies sore throat, cough, shortness of breath, GI symptoms.     Physical Exam   Triage Vital Signs: ED Triage Vitals  Enc Vitals Group     BP 03/02/23 1540 (!) 133/59     Pulse Rate 03/02/23 1540 100     Resp 03/02/23 1540 20     Temp 03/02/23 1540 (!) 102.8 F (39.3 C)     Temp Source 03/02/23 1540 Oral     SpO2 03/02/23 1540 100 %     Weight 03/02/23 1537 148 lb (67.1 kg)     Height 03/02/23 1537 5\' 6"  (1.676 m)     Head Circumference --      Peak Flow --      Pain Score 03/02/23 1537 10     Pain Loc --      Pain Edu? --      Excl. in [place]? --     Most recent vital signs: Vitals:   03/02/23 1540 03/02/23 1627  BP: (!) 133/59   Pulse: 100 97  Resp: 20 18  Temp: (!) 102.8 F (39.3 C) (!) 102.5 F (39.2 C)  SpO2: 100% 97%     General: Alert and in no acute distress. ENT:      Ears:       Nose: No congestion/rhinnorhea.      Mouth/Throat: Mucous membranes are moist.  Tonsils are bilaterally-purge reviewed with erythema and exudates. Neck: No stridor. No cervical spine tenderness to palpation.  Cardiovascular:  Good peripheral perfusion Respiratory: Normal respiratory effort without tachypnea or retractions. Lungs CTAB. Good air entry to the bases with no decreased or absent breath sounds Musculoskeletal: Full range of motion to all extremities.  Neurologic:  No gross focal neurologic deficits are appreciated.  Skin:   No rash noted Other:   ED Results / Procedures / Treatments   Labs (all labs ordered are listed, but only abnormal results are displayed) Labs Reviewed  GROUP A STREP BY PCR - Abnormal; Notable for the following components:      Result Value   Group A Strep  by PCR DETECTED (*)    All other components within normal limits  RESP PANEL BY RT-PCR (RSV, FLU A&B, COVID)  RVPGX2     EKG     RADIOLOGY    No results found.  PROCEDURES:  Critical Care performed: No  Procedures   MEDICATIONS ORDERED IN ED: Medications  acetaminophen (TYLENOL) tablet 1,000 mg (1,000 mg Oral Given 03/02/23 1546)     IMPRESSION / MDM / ASSESSMENT AND PLAN / ED COURSE  I reviewed the triage vital signs and the nursing notes.                                 Differential diagnosis includes, but is not limited to, viral illness, COVID, flu, strep, meningitis, pneumonia, bronchitis  Patient's presentation is most consistent with acute presentation with potential threat to life or bodily function.   Patient's diagnosis is consistent with strep.  Patient presents emergency department headache, body aches, fever.  Findings on exam are consistent with strep which was confirmed by testing.  Negative viral swab.  Patient with no concerning neurodeficits or nuchal rigidity.  As such patient will be treated with antibiotics for strep.  Follow-up primary care as needed.  Return precautions discussed with the patient.. Patient is given ED precautions to return to the ED for any worsening or new symptoms.     FINAL CLINICAL IMPRESSION(S) / ED DIAGNOSES   Final diagnoses:  Strep pharyngitis     Rx / DC Orders   ED Discharge Orders          Ordered    amoxicillin (AMOXIL) 875 MG tablet  2 times daily        03/02/23 1732             Note:  This document was prepared using Dragon voice recognition software and may include unintentional dictation errors.   Racheal Patches, PA-C 03/02/23 1732    Merwyn Katos, MD 03/03/23 [phone]

## 2023-03-02 NOTE — ED Triage Notes (Signed)
Pt reports a fever for 3 days with a headache and bodyaches.  Pt states decreased appetite.  No n/v/d  pt alert.   No otc meds today.

## 2023-03-19 ENCOUNTER — Ambulatory Visit: Payer: Medicaid Other

## 2023-03-31 ENCOUNTER — Ambulatory Visit: Payer: Medicaid Other | Admitting: Gerontology

## 2023-04-20 DIAGNOSIS — B20 Human immunodeficiency virus [HIV] disease: Secondary | ICD-10-CM | POA: Diagnosis not present

## 2023-04-20 DIAGNOSIS — A539 Syphilis, unspecified: Secondary | ICD-10-CM | POA: Diagnosis not present

## 2023-04-20 DIAGNOSIS — Z131 Encounter for screening for diabetes mellitus: Secondary | ICD-10-CM | POA: Diagnosis not present

## 2023-04-20 DIAGNOSIS — F4321 Adjustment disorder with depressed mood: Secondary | ICD-10-CM | POA: Diagnosis not present

## 2023-04-20 DIAGNOSIS — Z1389 Encounter for screening for other disorder: Secondary | ICD-10-CM | POA: Diagnosis not present

## 2023-05-18 DIAGNOSIS — Z23 Encounter for immunization: Secondary | ICD-10-CM | POA: Diagnosis not present

## 2023-05-18 DIAGNOSIS — B20 Human immunodeficiency virus [HIV] disease: Secondary | ICD-10-CM | POA: Diagnosis not present

## 2023-05-18 DIAGNOSIS — F4321 Adjustment disorder with depressed mood: Secondary | ICD-10-CM | POA: Diagnosis not present

## 2023-05-18 DIAGNOSIS — Z1389 Encounter for screening for other disorder: Secondary | ICD-10-CM | POA: Diagnosis not present

## 2023-05-18 DIAGNOSIS — A539 Syphilis, unspecified: Secondary | ICD-10-CM | POA: Diagnosis not present

## 2023-07-03 DIAGNOSIS — R0981 Nasal congestion: Secondary | ICD-10-CM | POA: Diagnosis not present

## 2023-07-03 DIAGNOSIS — J039 Acute tonsillitis, unspecified: Secondary | ICD-10-CM | POA: Diagnosis not present

## 2023-07-13 DIAGNOSIS — Z1389 Encounter for screening for other disorder: Secondary | ICD-10-CM | POA: Diagnosis not present

## 2023-07-13 DIAGNOSIS — F4321 Adjustment disorder with depressed mood: Secondary | ICD-10-CM | POA: Diagnosis not present

## 2023-07-13 DIAGNOSIS — A539 Syphilis, unspecified: Secondary | ICD-10-CM | POA: Diagnosis not present

## 2023-07-13 DIAGNOSIS — Z23 Encounter for immunization: Secondary | ICD-10-CM | POA: Diagnosis not present

## 2023-07-13 DIAGNOSIS — B20 Human immunodeficiency virus [HIV] disease: Secondary | ICD-10-CM | POA: Diagnosis not present

## 2023-08-13 DIAGNOSIS — Z1389 Encounter for screening for other disorder: Secondary | ICD-10-CM | POA: Diagnosis not present

## 2023-08-13 DIAGNOSIS — B20 Human immunodeficiency virus [HIV] disease: Secondary | ICD-10-CM | POA: Diagnosis not present

## 2023-08-17 ENCOUNTER — Emergency Department: Payer: Medicaid Other

## 2023-08-17 ENCOUNTER — Other Ambulatory Visit: Payer: Self-pay

## 2023-08-17 ENCOUNTER — Emergency Department
Admission: EM | Admit: 2023-08-17 | Discharge: 2023-08-17 | Disposition: A | Discharge status: Home / Self Care | Payer: Medicaid Other | Attending: Emergency Medicine | Admitting: Emergency Medicine

## 2023-08-17 DIAGNOSIS — J9801 Acute bronchospasm: Secondary | ICD-10-CM | POA: Insufficient documentation

## 2023-08-17 DIAGNOSIS — R0602 Shortness of breath: Secondary | ICD-10-CM | POA: Diagnosis not present

## 2023-08-17 DIAGNOSIS — Z1152 Encounter for screening for COVID-19: Secondary | ICD-10-CM | POA: Insufficient documentation

## 2023-08-17 LAB — RESP PANEL BY RT-PCR (RSV, FLU A&B, COVID)  RVPGX2
Influenza A by PCR: NEGATIVE
Influenza B by PCR: NEGATIVE
Resp Syncytial Virus by PCR: NEGATIVE
SARS Coronavirus 2 by RT PCR: NEGATIVE

## 2023-08-17 MED ORDER — HYDROCOD POLI-CHLORPHE POLI ER 10-8 MG/5ML PO SUER
5.0000 mL | Freq: Once | ORAL | Status: AC
  Administered 2023-08-17: 5 mL via ORAL
  Filled 2023-08-17: qty 5

## 2023-08-17 MED ORDER — PREDNISONE 20 MG PO TABS
60.0000 mg | ORAL_TABLET | Freq: Once | ORAL | Status: AC
  Administered 2023-08-17: 60 mg via ORAL
  Filled 2023-08-17: qty 3

## 2023-08-17 MED ORDER — ALBUTEROL SULFATE (2.5 MG/3ML) 0.083% IN NEBU
5.0000 mg | INHALATION_SOLUTION | Freq: Once | RESPIRATORY_TRACT | Status: AC
  Administered 2023-08-17: 5 mg via RESPIRATORY_TRACT
  Filled 2023-08-17: qty 6

## 2023-08-17 MED ORDER — HYDROCOD POLI-CHLORPHE POLI ER 10-8 MG/5ML PO SUER: 5.0000 mL | Freq: Two times a day (BID) | ORAL | 0 refills | Status: AC | PRN

## 2023-08-17 MED ORDER — ALBUTEROL SULFATE HFA 108 (90 BASE) MCG/ACT IN AERS: 2.0000 | INHALATION_SPRAY | RESPIRATORY_TRACT | 0 refills | Status: DC | PRN

## 2023-08-17 MED ORDER — PREDNISONE 20 MG PO TABS: 60.0000 mg | ORAL_TABLET | Freq: Every day | ORAL | 0 refills | Status: DC

## 2023-08-17 MED ORDER — IPRATROPIUM BROMIDE 0.02 % IN SOLN
0.5000 mg | Freq: Once | RESPIRATORY_TRACT | Status: AC
  Administered 2023-08-17: 0.5 mg via RESPIRATORY_TRACT
  Filled 2023-08-17: qty 2.5

## 2023-08-17 NOTE — ED Notes (Signed)
Persistent cough subsiding with breathing treatments. Pt tolerating and responding well.

## 2023-08-17 NOTE — ED Triage Notes (Signed)
Pt to ED via POV c/o SHOB and cough. Pt reports it SHOB started last night. Pt has been coughing for about 1 week. Pt denies any med hx. Pt also endorses central chest tightness when he walks.

## 2023-08-17 NOTE — Discharge Instructions (Addendum)
You may alternate Tylenol 1000 mg every 6 hours as needed for pain, fever and Ibuprofen 800 mg every 6-8 hours as needed for pain, fever.  Please take Ibuprofen with food.  Do not take more than 4000 mg of Tylenol (acetaminophen) in a 24 hour period.  Please take your steroids until complete.  You may use your albuterol inhaler as needed for shortness of breath and wheezing and take your cough medication as needed for cough.  This cough medication is a narcotic and can make you very drowsy.  You are being provided a prescription for opiates (also known as narcotics) for pain control.  Opiates can be addictive and should only be used when absolutely necessary for pain control when other alternatives do not work.  We recommend you only use them for the recommended amount of time and only as prescribed.  Please do not take with other sedative medications or alcohol.  Please do not drive, operate machinery, make important decisions while taking opiates.  Please note that these medications can be addictive and have high abuse potential.  Patients can become addicted to narcotics after only taking them for a few days.  Please keep these medications locked away from children, teenagers or any family members with history of substance abuse.  Narcotic pain medicine may also make you constipated.  You may use over-the-counter medications such as MiraLAX, Colace to prevent constipation.  If you become constipated, you may use over-the-counter enemas as needed.  Itching and nausea are also common side effects of narcotic pain medication.  If you develop uncontrolled vomiting or a rash, please stop these medications and seek medical care.   Please go to the following website to schedule new (and existing) patient appointments:   [email or web address]   The following is a list of primary care offices in the area who are accepting new patients at this time.  Please reach out to one of them  directly and let them know you would like to schedule an appointment to follow up on an Emergency Department visit, and/or to establish a new primary care provider (PCP).  There are likely other primary care clinics in the are who are accepting new patients, but this is an excellent place to start:  [facility] Lead physician: Dr Shirlee Latch [address] [place], [place] [address] [phone]  [facility] Lead Physician: Dr Alba Cory [address], [place], [place] [address] [phone]  [facility]  Lead Physician: Dr Olevia Perches [address] [place], [place] [address] [phone]  [facility] Lead Physician: Dr Sofie Hartigan [address] [place], [place] [address] [phone]  [facility] Lead Physician: Dr Bari Edward [address] [place], [place], [place] [address] [phone]
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
## 2023-09-16 ENCOUNTER — Emergency Department
Admission: EM | Admit: 2023-09-16 | Discharge: 2023-09-16 | Disposition: A | Discharge status: Home / Self Care | Payer: No Typology Code available for payment source | Attending: Student in an Organized Health Care Education/Training Program | Admitting: Student in an Organized Health Care Education/Training Program

## 2023-09-16 ENCOUNTER — Emergency Department: Payer: No Typology Code available for payment source

## 2023-09-16 ENCOUNTER — Other Ambulatory Visit: Payer: Self-pay

## 2023-09-16 DIAGNOSIS — R109 Unspecified abdominal pain: Secondary | ICD-10-CM | POA: Insufficient documentation

## 2023-09-16 DIAGNOSIS — R519 Headache, unspecified: Secondary | ICD-10-CM | POA: Insufficient documentation

## 2023-09-16 DIAGNOSIS — R9431 Abnormal electrocardiogram [ECG] [EKG]: Secondary | ICD-10-CM | POA: Diagnosis not present

## 2023-09-16 DIAGNOSIS — S0093XA Contusion of unspecified part of head, initial encounter: Secondary | ICD-10-CM | POA: Diagnosis not present

## 2023-09-16 DIAGNOSIS — M546 Pain in thoracic spine: Secondary | ICD-10-CM | POA: Insufficient documentation

## 2023-09-16 DIAGNOSIS — Y92410 Unspecified street and highway as the place of occurrence of the external cause: Secondary | ICD-10-CM | POA: Diagnosis not present

## 2023-09-16 DIAGNOSIS — M542 Cervicalgia: Secondary | ICD-10-CM | POA: Diagnosis not present

## 2023-09-16 DIAGNOSIS — S0990XA Unspecified injury of head, initial encounter: Secondary | ICD-10-CM | POA: Diagnosis not present

## 2023-09-16 DIAGNOSIS — M549 Dorsalgia, unspecified: Secondary | ICD-10-CM | POA: Diagnosis not present

## 2023-09-16 DIAGNOSIS — M545 Low back pain, unspecified: Secondary | ICD-10-CM | POA: Diagnosis not present

## 2023-09-16 DIAGNOSIS — S199XXA Unspecified injury of neck, initial encounter: Secondary | ICD-10-CM | POA: Diagnosis not present

## 2023-09-16 LAB — BASIC METABOLIC PANEL
Anion gap: 7 (ref 5–15)
BUN: 12 mg/dL (ref 6–20)
CO2: 23 mmol/L (ref 22–32)
Calcium: 8.8 mg/dL — ABNORMAL LOW (ref 8.9–10.3)
Chloride: 106 mmol/L (ref 98–111)
Creatinine, Ser: 0.87 mg/dL (ref 0.61–1.24)
GFR, Estimated: >60 mL/min (ref >60)
Glucose, Bld: 98 mg/dL (ref 70–99)
Potassium: 3.9 mmol/L (ref 3.5–5.1)
Sodium: 136 mmol/L (ref 135–145)

## 2023-09-16 LAB — CBC
HCT: 38.5 % — ABNORMAL LOW (ref 39.0–52.0)
Hemoglobin: 13.7 g/dL (ref 13.0–17.0)
MCH: 33.6 pg (ref 26.0–34.0)
MCHC: 35.6 g/dL (ref 30.0–36.0)
MCV: 94.4 fL (ref 80.0–100.0)
Platelets: 257 10*3/uL (ref 150–400)
RBC: 4.08 MIL/uL — ABNORMAL LOW (ref 4.22–5.81)
RDW: 11 % — ABNORMAL LOW (ref 11.5–15.5)
WBC: 4.5 10*3/uL (ref 4.0–10.5)
nRBC: 0 % (ref 0.0–0.2)

## 2023-09-16 MED ORDER — FENTANYL CITRATE PF 50 MCG/ML IJ SOSY
50.0000 ug | PREFILLED_SYRINGE | INTRAMUSCULAR | Status: DC | PRN
  Administered 2023-09-16: 50 ug via INTRAVENOUS
  Filled 2023-09-16 (×2): qty 1

## 2023-09-16 MED ORDER — IOHEXOL 300 MG/ML  SOLN
100.0000 mL | Freq: Once | INTRAMUSCULAR | Status: AC | PRN
  Administered 2023-09-16: 100 mL via INTRAVENOUS

## 2023-09-16 MED ORDER — CYCLOBENZAPRINE HCL 10 MG PO TABS: 10.0000 mg | ORAL_TABLET | Freq: Three times a day (TID) | ORAL | 0 refills | Status: AC | PRN
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
## 2023-09-16 NOTE — ED Notes (Signed)
Pt refusing pain medication at this time. Pt informed to make RN aware if he does want pain medication.
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
## 2023-12-14 ENCOUNTER — Telehealth: Payer: Self-pay | Admitting: *Deleted

## 2023-12-14 DIAGNOSIS — L309 Dermatitis, unspecified: Secondary | ICD-10-CM

## 2023-12-18 ENCOUNTER — Other Ambulatory Visit: Payer: Self-pay | Admitting: Obstetrics and Gynecology

## 2023-12-18 NOTE — Patient Outreach (Signed)
Care Coordination  12/18/2023  Gabriel Baker June 18, 1996 [id number]  RNCM called patient at scheduled time.  Patient answered phone and stated he would call me back.   Kathi Der RN, BSN, [employer] [facility] RN Care Manager Direct Dial [phone] Website: [email or web address]

## 2024-02-04 DIAGNOSIS — Z1389 Encounter for screening for other disorder: Secondary | ICD-10-CM | POA: Diagnosis not present

## 2024-02-04 DIAGNOSIS — B20 Human immunodeficiency virus [HIV] disease: Secondary | ICD-10-CM | POA: Diagnosis not present

## 2024-03-20 DIAGNOSIS — K047 Periapical abscess without sinus: Secondary | ICD-10-CM | POA: Diagnosis not present

## 2024-03-20 DIAGNOSIS — K029 Dental caries, unspecified: Secondary | ICD-10-CM | POA: Diagnosis not present

## 2024-05-06 ENCOUNTER — Telehealth: Payer: Self-pay

## 2024-05-06 DIAGNOSIS — L308 Other specified dermatitis: Secondary | ICD-10-CM

## 2024-05-06 DIAGNOSIS — L700 Acne vulgaris: Secondary | ICD-10-CM

## 2024-05-06 NOTE — Progress Notes (Signed)
 Complex Care Management Note  Care Guide Note 05/06/2024 Name: Gabriel Baker MRN: [id number] DOB: 10/22/1996  Gabriel Baker is a 28 y.o. year old male who sees Pcp, No for primary care. I reached out to Gabriel Baker by phone today to offer complex care management services.  [name] was given information about Complex Care Management services today including:   The Complex Care Management services include support from the care team which includes your Nurse Care Manager, Clinical Social Worker, or Pharmacist.  The Complex Care Management team is here to help remove barriers to the health concerns and goals most important to you. Complex Care Management services are voluntary, and the patient may decline or stop services at any time by request to their care team member.   Complex Care Management Consent Status: Patient did not agree to participate in complex care management services at this time.  Follow up plan:    Encounter Outcome:  Patient Refused  Lenton Rail , RMA     [facility]  [facility], [facility] Guide  Direct Dial: [phone]  Website: [email or web address]

## 2024-07-21 DIAGNOSIS — R030 Elevated blood-pressure reading, without diagnosis of hypertension: Secondary | ICD-10-CM | POA: Diagnosis not present

## 2024-07-21 DIAGNOSIS — R1011 Right upper quadrant pain: Secondary | ICD-10-CM | POA: Diagnosis not present

## 2024-07-21 DIAGNOSIS — R110 Nausea: Secondary | ICD-10-CM | POA: Diagnosis not present

## 2024-07-21 DIAGNOSIS — R1012 Left upper quadrant pain: Secondary | ICD-10-CM | POA: Diagnosis not present

## 2024-08-21 DIAGNOSIS — R051 Acute cough: Secondary | ICD-10-CM | POA: Diagnosis not present

## 2024-08-21 DIAGNOSIS — J302 Other seasonal allergic rhinitis: Secondary | ICD-10-CM | POA: Diagnosis not present

## 2024-08-21 DIAGNOSIS — R0602 Shortness of breath: Secondary | ICD-10-CM | POA: Diagnosis not present

## 2024-08-21 DIAGNOSIS — J45901 Unspecified asthma with (acute) exacerbation: Secondary | ICD-10-CM | POA: Diagnosis not present

## 2024-08-29 DIAGNOSIS — Z1389 Encounter for screening for other disorder: Secondary | ICD-10-CM | POA: Diagnosis not present

## 2024-08-29 DIAGNOSIS — Z23 Encounter for immunization: Secondary | ICD-10-CM | POA: Diagnosis not present

## 2024-08-29 DIAGNOSIS — B20 Human immunodeficiency virus [HIV] disease: Secondary | ICD-10-CM | POA: Diagnosis not present

## 2024-09-26 DIAGNOSIS — K029 Dental caries, unspecified: Secondary | ICD-10-CM | POA: Diagnosis not present

## 2024-09-26 DIAGNOSIS — K047 Periapical abscess without sinus: Secondary | ICD-10-CM | POA: Diagnosis not present

## 2024-12-19 ENCOUNTER — Emergency Department
Admission: EM | Admit: 2024-12-19 | Discharge: 2024-12-19 | Disposition: A | Discharge status: Home / Self Care | Attending: Emergency Medicine | Admitting: Emergency Medicine

## 2024-12-19 ENCOUNTER — Emergency Department

## 2024-12-19 ENCOUNTER — Other Ambulatory Visit: Payer: Self-pay

## 2024-12-19 DIAGNOSIS — R0602 Shortness of breath: Secondary | ICD-10-CM

## 2024-12-19 DIAGNOSIS — J4541 Moderate persistent asthma with (acute) exacerbation: Secondary | ICD-10-CM | POA: Insufficient documentation

## 2024-12-19 LAB — CBC WITH DIFFERENTIAL/PLATELET
Abs Immature Granulocytes: 0.02 10*3/uL (ref 0.00–0.07)
Basophils Absolute: 0.1 10*3/uL (ref 0.0–0.1)
Basophils Relative: 1 %
Eosinophils Absolute: 0.7 10*3/uL — ABNORMAL HIGH (ref 0.0–0.5)
Eosinophils Relative: 13 %
HCT: 40.3 % (ref 39.0–52.0)
Hemoglobin: 14.4 g/dL (ref 13.0–17.0)
Immature Granulocytes: 0 %
Lymphocytes Relative: 44 %
Lymphs Abs: 2.4 10*3/uL (ref 0.7–4.0)
MCH: 33.4 pg (ref 26.0–34.0)
MCHC: 35.7 g/dL (ref 30.0–36.0)
MCV: 93.5 fL (ref 80.0–100.0)
Monocytes Absolute: 0.5 10*3/uL (ref 0.1–1.0)
Monocytes Relative: 9 %
Neutro Abs: 1.9 10*3/uL (ref 1.7–7.7)
Neutrophils Relative %: 33 %
Platelets: 200 10*3/uL (ref 150–400)
RBC: 4.31 MIL/uL (ref 4.22–5.81)
RDW: 10.8 % — ABNORMAL LOW (ref 11.5–15.5)
WBC: 5.6 10*3/uL (ref 4.0–10.5)
nRBC: 0 % (ref 0.0–0.2)

## 2024-12-19 LAB — BASIC METABOLIC PANEL WITH GFR
Anion gap: 11 (ref 5–15)
BUN: 7 mg/dL (ref 6–20)
CO2: 24 mmol/L (ref 22–32)
Calcium: 8.4 mg/dL — ABNORMAL LOW (ref 8.9–10.3)
Chloride: 106 mmol/L (ref 98–111)
Creatinine, Ser: 0.94 mg/dL (ref 0.61–1.24)
GFR, Estimated: >60 mL/min
Glucose, Bld: 98 mg/dL (ref 70–99)
Potassium: 3.7 mmol/L (ref 3.5–5.1)
Sodium: 141 mmol/L (ref 135–145)

## 2024-12-19 LAB — RESP PANEL BY RT-PCR (RSV, FLU A&B, COVID)  RVPGX2
Influenza A by PCR: NEGATIVE
Influenza B by PCR: NEGATIVE
Resp Syncytial Virus by PCR: NEGATIVE
SARS Coronavirus 2 by RT PCR: NEGATIVE

## 2024-12-19 MED ORDER — PREDNISONE 10 MG (21) PO TBPK: ORAL_TABLET | ORAL | 0 refills | Status: AC

## 2024-12-19 MED ORDER — IPRATROPIUM-ALBUTEROL 0.5-2.5 (3) MG/3ML IN SOLN
9.0000 mL | Freq: Once | RESPIRATORY_TRACT | Status: AC
  Administered 2024-12-19: 9 mL via RESPIRATORY_TRACT

## 2024-12-19 MED ORDER — METHYLPREDNISOLONE SODIUM SUCC 125 MG IJ SOLR
125.0000 mg | Freq: Once | INTRAMUSCULAR | Status: AC
  Administered 2024-12-19: 125 mg via INTRAVENOUS
  Filled 2024-12-19: qty 2

## 2024-12-19 MED ORDER — FLUTICASONE-SALMETEROL 113-14 MCG/ACT IN AEPB: 1.0000 | INHALATION_SPRAY | Freq: Two times a day (BID) | RESPIRATORY_TRACT | 2 refills | Status: AC

## 2024-12-19 MED ORDER — ALBUTEROL SULFATE HFA 108 (90 BASE) MCG/ACT IN AERS: 2.0000 | INHALATION_SPRAY | Freq: Four times a day (QID) | RESPIRATORY_TRACT | 2 refills | Status: AC | PRN

## 2024-12-19 NOTE — ED Provider Notes (Signed)
 "  [facility] Provider Note   Event Date/Time   First MD Initiated Contact with Patient 12/19/24 0310     (approximate) History  Shortness of Breath  HPI Gabriel Baker is a 29 y.o. male with stated past medical history of presents complaining of worsening shortness of breath over the last.  Patient's significant other provides this history as patient is in significant respiratory distress at this time.  Significant other states the patient uses his albuterol  inhaler daily to try to control the symptoms however they have been significantly worsening over the last month to the point tonight where patient could not get to sleep due to feeling short of breath.  Patient has not been on a daily controller medication for his asthma. ROS: Patient currently denies any vision changes, tinnitus, difficulty speaking, facial droop, sore throat, chest pain, abdominal pain, nausea/vomiting/diarrhea, dysuria, or weakness/numbness/paresthesias in any extremity   Physical Exam  Triage Vital Signs: ED Triage Vitals  Encounter Vitals Group     BP 12/19/24 0315 (!) 154/99     Girls Systolic BP Percentile --      Girls Diastolic BP Percentile --      Boys Systolic BP Percentile --      Boys Diastolic BP Percentile --      Pulse Rate 12/19/24 0315 (!) 103     Resp 12/19/24 0315 (!) 24     Temp 12/19/24 0315 97.6 F (36.4 C)     Temp Source 12/19/24 0315 Oral     SpO2 12/19/24 0315 99 %     Weight 12/19/24 0316 150 lb (68 kg)     Height 12/19/24 0316 5' 6 (1.676 m)     Head Circumference --      Peak Flow --      Pain Score 12/19/24 0315 0     Pain Loc --      Pain Education --      Exclude from Growth Chart --    Most recent vital signs: Vitals:   12/19/24 0320 12/19/24 0402  BP:  (!) 148/88  Pulse:  84  Resp:  20  Temp:    SpO2: 95% 98%   General: Awake, oriented x4. CV:  Good peripheral perfusion. Resp:  Increased effort.  Inspiratory and extra wheezing over  bilateral lung fields Abd:  No distention. Other:  Young adult well-developed, well-nourished African-American male resting comfortably in no acute distress ED Results / Procedures / Treatments  Labs (all labs ordered are listed, but only abnormal results are displayed) Labs Reviewed  CBC WITH DIFFERENTIAL/PLATELET - Abnormal; Notable for the following components:      Result Value   RDW 10.8 (*)    Eosinophils Absolute 0.7 (*)    All other components within normal limits  BASIC METABOLIC PANEL WITH GFR - Abnormal; Notable for the following components:   Calcium 8.4 (*)    All other components within normal limits  RESP PANEL BY RT-PCR (RSV, FLU A&B, COVID)  [id number]   EKG ED ECG REPORT I, Artist MARLA Kerns, the attending physician, personally viewed and interpreted this ECG. Date: 12/19/2024 EKG Time: 0317 Rate: 86 Rhythm: normal sinus rhythm QRS Axis: normal Intervals: normal ST/T Wave abnormalities: normal Narrative Interpretation: no evidence of acute ischemia RADIOLOGY ED MD interpretation: 2 view chest x-ray interpreted by me shows no evidence of acute abnormalities including no pneumonia, pneumothorax, or widened mediastinum - All radiology independently interpreted and agree with radiology assessment Official radiology report(s):  DG Chest 2 View Result Date: 12/19/2024 EXAM: 2 VIEW(S) XRAY OF THE CHEST 12/19/2024 03:47:00 AM COMPARISON: Chest x-ray dated 08/17/2023. CLINICAL HISTORY: Shortness of breath. FINDINGS: LUNGS AND PLEURA: No focal pulmonary opacity. No pleural effusion. No pneumothorax. HEART AND MEDIASTINUM: No acute abnormality of the cardiac and mediastinal silhouettes. BONES AND SOFT TISSUES: No acute osseous abnormality. IMPRESSION: 1. No acute cardiopulmonary process. Electronically signed by: Greig Pique MD 12/19/2024 04:02 AM EST RP Workstation: [id number]   PROCEDURES: Critical Care performed: No Procedures MEDICATIONS ORDERED IN ED: Medications   ipratropium-albuterol  (DUONEB) 0.5-2.5 (3) MG/3ML nebulizer solution 9 mL (9 mLs Nebulization Given 12/19/24 0313)  methylPREDNISolone  sodium succinate (SOLU-MEDROL ) 125 mg/2 mL injection 125 mg (125 mg Intravenous Given 12/19/24 0320)   IMPRESSION / MDM / ASSESSMENT AND PLAN / ED COURSE  I reviewed the triage vital signs and the nursing notes.                             The patient is on the cardiac monitor to evaluate for evidence of arrhythmia and/or significant heart rate changes. Patient's presentation is most consistent with acute presentation with potential threat to life or bodily function. Patient is a 29 year old male with the above-stated past medical history presents complaining of worsening shortness of breath of the last month DDx: Asthma exacerbation, COPD exacerbation, upper respiratory infection, pneumonia Plan: CBC, BMP, RVP, chest x-ray  Patient's respiratory status greatly improved and is comfortable being discharged at this time.  Patient given instructions on how to use daily controller medications as well as a AeroChamber for his albuterol  inhaler.  Patient expressed understanding and all questions were answered prior to discharge.  Patient given strict return precautions and agreed to follow-up with primary care physician within the next 1 to 3 days for resolution of his symptoms.  Dispo: Discharge home with PCP follow-up   FINAL CLINICAL IMPRESSION(S) / ED DIAGNOSES   Final diagnoses:  Moderate persistent asthma with exacerbation  SOB (shortness of breath)   Rx / DC Orders   ED Discharge Orders          Ordered    Ambulatory Referral to Primary Care (Establish Care)        12/19/24 0427    predniSONE  (STERAPRED UNI-PAK 21 TAB) 10 MG (21) TBPK tablet        12/19/24 0428    Fluticasone -Salmeterol 113-14 MCG/ACT AEPB  2 times daily        12/19/24 0428    albuterol  (VENTOLIN  HFA) 108 (90 Base) MCG/ACT inhaler  Every 6 hours PRN        12/19/24 0428            Note:  This document was prepared using Dragon voice recognition software and may include unintentional dictation errors.   Traylen Eckels K, MD 12/19/24 [phone]  "

## 2024-12-19 NOTE — ED Triage Notes (Signed)
 Reports worsening SOB and asthma over the last 2 wks. No rescue inhaler at home. Woke up at 0000 worse. Pt tripoding while initial triage. Inspiratory and expiratory wheeze in all fields. Pt alert and oriented. Nebs started prior to completing triage. MD now in room assessing pt.

## 2024-12-19 NOTE — ED Notes (Signed)
 ED Provider at bedside.
